# Patient Record
Sex: Male | Born: 1955 | Race: White | Hispanic: No | Marital: Single | Smoking: Current every day smoker
Health system: Southern US, Community
[De-identification: ages and names within clinical notes are randomized; demographics above are authoritative.]

## PROBLEM LIST (undated history)

## (undated) DIAGNOSIS — J439 Emphysema, unspecified: Secondary | ICD-10-CM

## (undated) DIAGNOSIS — I7121 Aneurysm of the ascending aorta, without rupture: Secondary | ICD-10-CM

## (undated) DIAGNOSIS — M5030 Other cervical disc degeneration, unspecified cervical region: Secondary | ICD-10-CM

## (undated) DIAGNOSIS — H9193 Unspecified hearing loss, bilateral: Secondary | ICD-10-CM

## (undated) DIAGNOSIS — R918 Other nonspecific abnormal finding of lung field: Secondary | ICD-10-CM

## (undated) DIAGNOSIS — C439 Malignant melanoma of skin, unspecified: Secondary | ICD-10-CM

## (undated) DIAGNOSIS — M419 Scoliosis, unspecified: Secondary | ICD-10-CM

## (undated) DIAGNOSIS — M549 Dorsalgia, unspecified: Secondary | ICD-10-CM

## (undated) HISTORY — PX: MELANOMA EXCISION: SHX5266

## (undated) HISTORY — DX: Malignant melanoma of skin, unspecified: C43.9

## (undated) HISTORY — PX: NASAL SINUS SURGERY: SHX719

## (undated) HISTORY — DX: Aneurysm of the ascending aorta, without rupture: I71.21

---

## 2010-06-30 ENCOUNTER — Emergency Department (HOSPITAL_COMMUNITY)
Admission: EM | Admit: 2010-06-30 | Discharge: 2010-06-30 | Disposition: A | Discharge status: Home / Self Care | Payer: Non-veteran care | Attending: Emergency Medicine | Admitting: Emergency Medicine

## 2010-06-30 ENCOUNTER — Emergency Department (HOSPITAL_COMMUNITY)
Admit: 2010-06-30 | Discharge: 2010-06-30 | Disposition: A | Discharge status: Home / Self Care | Payer: Non-veteran care

## 2010-06-30 DIAGNOSIS — S7000XA Contusion of unspecified hip, initial encounter: Secondary | ICD-10-CM | POA: Insufficient documentation

## 2010-06-30 DIAGNOSIS — W108XXA Fall (on) (from) other stairs and steps, initial encounter: Secondary | ICD-10-CM | POA: Insufficient documentation

## 2010-06-30 DIAGNOSIS — S20229A Contusion of unspecified back wall of thorax, initial encounter: Secondary | ICD-10-CM | POA: Insufficient documentation

## 2010-06-30 DIAGNOSIS — F329 Major depressive disorder, single episode, unspecified: Secondary | ICD-10-CM | POA: Insufficient documentation

## 2010-06-30 DIAGNOSIS — R109 Unspecified abdominal pain: Secondary | ICD-10-CM | POA: Insufficient documentation

## 2010-06-30 DIAGNOSIS — M545 Low back pain, unspecified: Secondary | ICD-10-CM | POA: Insufficient documentation

## 2010-06-30 DIAGNOSIS — Y929 Unspecified place or not applicable: Secondary | ICD-10-CM | POA: Insufficient documentation

## 2010-06-30 DIAGNOSIS — I10 Essential (primary) hypertension: Secondary | ICD-10-CM | POA: Insufficient documentation

## 2010-06-30 DIAGNOSIS — M2550 Pain in unspecified joint: Secondary | ICD-10-CM | POA: Insufficient documentation

## 2010-06-30 DIAGNOSIS — F3289 Other specified depressive episodes: Secondary | ICD-10-CM | POA: Insufficient documentation

## 2010-07-03 ENCOUNTER — Emergency Department (HOSPITAL_COMMUNITY): Admit: 2010-07-03 | Discharge: 2010-07-03 | Disposition: A | Discharge status: Home / Self Care | Payer: Medicare Other

## 2010-07-03 ENCOUNTER — Emergency Department (HOSPITAL_COMMUNITY)
Admission: EM | Admit: 2010-07-03 | Discharge: 2010-07-03 | Disposition: A | Discharge status: Home / Self Care | Payer: Medicare Other | Attending: Emergency Medicine | Admitting: Emergency Medicine

## 2010-07-03 DIAGNOSIS — M545 Low back pain, unspecified: Secondary | ICD-10-CM | POA: Insufficient documentation

## 2010-07-03 DIAGNOSIS — I10 Essential (primary) hypertension: Secondary | ICD-10-CM | POA: Insufficient documentation

## 2010-07-03 DIAGNOSIS — IMO0002 Reserved for concepts with insufficient information to code with codable children: Secondary | ICD-10-CM | POA: Insufficient documentation

## 2010-07-03 DIAGNOSIS — F329 Major depressive disorder, single episode, unspecified: Secondary | ICD-10-CM | POA: Insufficient documentation

## 2010-07-03 DIAGNOSIS — R42 Dizziness and giddiness: Secondary | ICD-10-CM | POA: Insufficient documentation

## 2010-07-03 DIAGNOSIS — Y92009 Unspecified place in unspecified non-institutional (private) residence as the place of occurrence of the external cause: Secondary | ICD-10-CM | POA: Insufficient documentation

## 2010-07-03 DIAGNOSIS — S32009A Unspecified fracture of unspecified lumbar vertebra, initial encounter for closed fracture: Secondary | ICD-10-CM | POA: Insufficient documentation

## 2010-07-03 DIAGNOSIS — G8929 Other chronic pain: Secondary | ICD-10-CM | POA: Insufficient documentation

## 2010-07-03 DIAGNOSIS — F3289 Other specified depressive episodes: Secondary | ICD-10-CM | POA: Insufficient documentation

## 2010-07-03 DIAGNOSIS — W108XXA Fall (on) (from) other stairs and steps, initial encounter: Secondary | ICD-10-CM | POA: Insufficient documentation

## 2010-07-03 DIAGNOSIS — M4120 Other idiopathic scoliosis, site unspecified: Secondary | ICD-10-CM | POA: Insufficient documentation

## 2010-07-03 DIAGNOSIS — S20229A Contusion of unspecified back wall of thorax, initial encounter: Secondary | ICD-10-CM | POA: Insufficient documentation

## 2010-07-03 LAB — DIFFERENTIAL
Basophils Absolute: 0.1 10*3/uL (ref 0.0–0.1)
Basophils Relative: 1 % (ref 0–1)
Eosinophils Absolute: 0.4 10*3/uL (ref 0.0–0.7)
Eosinophils Relative: 4 % (ref 0–5)
Lymphocytes Relative: 19 % (ref 12–46)
Lymphs Abs: 2.1 10*3/uL (ref 0.7–4.0)
Monocytes Absolute: 0.8 10*3/uL (ref 0.1–1.0)
Monocytes Relative: 8 % (ref 3–12)
Neutro Abs: 7.6 10*3/uL (ref 1.7–7.7)
Neutrophils Relative %: 70 % (ref 43–77)

## 2010-07-03 LAB — CBC
HCT: 41.1 % (ref 39.0–52.0)
Hemoglobin: 14.2 g/dL (ref 13.0–17.0)
MCH: 32.1 pg (ref 26.0–34.0)
MCHC: 34.5 g/dL (ref 30.0–36.0)
MCV: 93 fL (ref 78.0–100.0)
Platelets: 232 10*3/uL (ref 150–400)
RBC: 4.42 MIL/uL (ref 4.22–5.81)
RDW: 12.8 % (ref 11.5–15.5)
WBC: 11 10*3/uL — ABNORMAL HIGH (ref 4.0–10.5)

## 2010-07-03 LAB — BASIC METABOLIC PANEL
BUN: 15 mg/dL (ref 6–23)
CO2: 27 mEq/L (ref 19–32)
Calcium: 9.2 mg/dL (ref 8.4–10.5)
Chloride: 104 mEq/L (ref 96–112)
Creatinine, Ser: 0.78 mg/dL (ref 0.4–1.5)
GFR calc Af Amer: >60 mL/min (ref >60)
GFR calc non Af Amer: >60 mL/min (ref >60)
Glucose, Bld: 99 mg/dL (ref 70–99)
Potassium: 4 mEq/L (ref 3.5–5.1)
Sodium: 138 mEq/L (ref 135–145)

## 2010-11-06 ENCOUNTER — Emergency Department (HOSPITAL_COMMUNITY): Payer: Medicare Other

## 2010-11-06 ENCOUNTER — Inpatient Hospital Stay (HOSPITAL_COMMUNITY)
Admission: EM | Admit: 2010-11-06 | Discharge: 2010-11-09 | DRG: 947 | Disposition: A | Discharge status: Home / Self Care | Payer: Medicare Other | Attending: Internal Medicine | Admitting: Internal Medicine

## 2010-11-06 ENCOUNTER — Emergency Department (HOSPITAL_COMMUNITY)
Admission: EM | Admit: 2010-11-06 | Discharge: 2010-11-06 | Disposition: A | Discharge status: Other Acute Inpatient Hospital | Payer: Medicare Other | Source: Home / Self Care | Attending: Emergency Medicine | Admitting: Emergency Medicine

## 2010-11-06 DIAGNOSIS — R7989 Other specified abnormal findings of blood chemistry: Secondary | ICD-10-CM | POA: Insufficient documentation

## 2010-11-06 DIAGNOSIS — R4182 Altered mental status, unspecified: Secondary | ICD-10-CM | POA: Insufficient documentation

## 2010-11-06 DIAGNOSIS — M1990 Unspecified osteoarthritis, unspecified site: Secondary | ICD-10-CM | POA: Diagnosis present

## 2010-11-06 DIAGNOSIS — I10 Essential (primary) hypertension: Secondary | ICD-10-CM | POA: Insufficient documentation

## 2010-11-06 DIAGNOSIS — G039 Meningitis, unspecified: Secondary | ICD-10-CM | POA: Diagnosis present

## 2010-11-06 DIAGNOSIS — F329 Major depressive disorder, single episode, unspecified: Secondary | ICD-10-CM | POA: Diagnosis present

## 2010-11-06 DIAGNOSIS — S30860A Insect bite (nonvenomous) of lower back and pelvis, initial encounter: Secondary | ICD-10-CM | POA: Diagnosis present

## 2010-11-06 DIAGNOSIS — N179 Acute kidney failure, unspecified: Secondary | ICD-10-CM | POA: Diagnosis present

## 2010-11-06 DIAGNOSIS — G0490 Encephalitis and encephalomyelitis, unspecified: Secondary | ICD-10-CM | POA: Diagnosis present

## 2010-11-06 DIAGNOSIS — IMO0002 Reserved for concepts with insufficient information to code with codable children: Secondary | ICD-10-CM | POA: Diagnosis present

## 2010-11-06 DIAGNOSIS — W57XXXA Bitten or stung by nonvenomous insect and other nonvenomous arthropods, initial encounter: Secondary | ICD-10-CM | POA: Diagnosis present

## 2010-11-06 DIAGNOSIS — M4120 Other idiopathic scoliosis, site unspecified: Secondary | ICD-10-CM | POA: Insufficient documentation

## 2010-11-06 DIAGNOSIS — N390 Urinary tract infection, site not specified: Secondary | ICD-10-CM | POA: Insufficient documentation

## 2010-11-06 DIAGNOSIS — D72829 Elevated white blood cell count, unspecified: Secondary | ICD-10-CM | POA: Insufficient documentation

## 2010-11-06 DIAGNOSIS — F3289 Other specified depressive episodes: Secondary | ICD-10-CM | POA: Insufficient documentation

## 2010-11-06 DIAGNOSIS — Y998 Other external cause status: Secondary | ICD-10-CM

## 2010-11-06 DIAGNOSIS — Y93H2 Activity, gardening and landscaping: Secondary | ICD-10-CM

## 2010-11-06 DIAGNOSIS — Y92009 Unspecified place in unspecified non-institutional (private) residence as the place of occurrence of the external cause: Secondary | ICD-10-CM

## 2010-11-06 DIAGNOSIS — N289 Disorder of kidney and ureter, unspecified: Secondary | ICD-10-CM | POA: Insufficient documentation

## 2010-11-06 LAB — CBC
HCT: 43.1 % (ref 39.0–52.0)
Hemoglobin: 14.4 g/dL (ref 13.0–17.0)
MCH: 31.6 pg (ref 26.0–34.0)
MCHC: 33.4 g/dL (ref 30.0–36.0)
MCV: 94.5 fL (ref 78.0–100.0)
Platelets: 285 10*3/uL (ref 150–400)
RBC: 4.56 MIL/uL (ref 4.22–5.81)
RDW: 14.1 % (ref 11.5–15.5)
WBC: 19.6 10*3/uL — ABNORMAL HIGH (ref 4.0–10.5)

## 2010-11-06 LAB — RAPID URINE DRUG SCREEN, HOSP PERFORMED
Amphetamines: NOT DETECTED
Barbiturates: NOT DETECTED
Benzodiazepines: POSITIVE — AB
Cocaine: NOT DETECTED
Opiates: POSITIVE — AB
Tetrahydrocannabinol: NOT DETECTED

## 2010-11-06 LAB — COMPREHENSIVE METABOLIC PANEL
ALT: 17 U/L (ref 0–53)
AST: 23 U/L (ref 0–37)
Albumin: 4.8 g/dL (ref 3.5–5.2)
Alkaline Phosphatase: 99 U/L (ref 39–117)
BUN: 34 mg/dL — ABNORMAL HIGH (ref 6–23)
CO2: 24 mEq/L (ref 19–32)
Calcium: 11.3 mg/dL — ABNORMAL HIGH (ref 8.4–10.5)
Chloride: 101 mEq/L (ref 96–112)
Creatinine, Ser: 1.69 mg/dL — ABNORMAL HIGH (ref 0.4–1.5)
GFR calc Af Amer: 51 mL/min — ABNORMAL LOW (ref >60)
GFR calc non Af Amer: 42 mL/min — ABNORMAL LOW (ref >60)
Glucose, Bld: 108 mg/dL — ABNORMAL HIGH (ref 70–99)
Potassium: 4.7 mEq/L (ref 3.5–5.1)
Sodium: 139 mEq/L (ref 135–145)
Total Bilirubin: 0.3 mg/dL (ref 0.3–1.2)
Total Protein: 9 g/dL — ABNORMAL HIGH (ref 6.0–8.3)

## 2010-11-06 LAB — SALICYLATE LEVEL: Salicylate Lvl: <2 mg/dL — ABNORMAL LOW (ref 2.8–20.0)

## 2010-11-06 LAB — URINALYSIS, ROUTINE W REFLEX MICROSCOPIC
Bilirubin Urine: NEGATIVE
Glucose, UA: NEGATIVE mg/dL
Hgb urine dipstick: NEGATIVE
Ketones, ur: 15 mg/dL — AB
Leukocytes, UA: NEGATIVE
Nitrite: NEGATIVE
Protein, ur: 30 mg/dL — AB
Specific Gravity, Urine: >1.03 — ABNORMAL HIGH (ref 1.005–1.030)
Urobilinogen, UA: 0.2 mg/dL (ref 0.0–1.0)
pH: 5 (ref 5.0–8.0)

## 2010-11-06 LAB — URINE MICROSCOPIC-ADD ON

## 2010-11-06 LAB — DIFFERENTIAL
Basophils Absolute: 0 10*3/uL (ref 0.0–0.1)
Basophils Relative: 0 % (ref 0–1)
Eosinophils Absolute: 0 10*3/uL (ref 0.0–0.7)
Eosinophils Relative: 0 % (ref 0–5)
Lymphocytes Relative: 14 % (ref 12–46)
Lymphs Abs: 2.7 10*3/uL (ref 0.7–4.0)
Monocytes Absolute: 1.2 10*3/uL — ABNORMAL HIGH (ref 0.1–1.0)
Monocytes Relative: 6 % (ref 3–12)
Neutro Abs: 15.7 10*3/uL — ABNORMAL HIGH (ref 1.7–7.7)
Neutrophils Relative %: 80 % — ABNORMAL HIGH (ref 43–77)

## 2010-11-06 LAB — BLOOD GAS, ARTERIAL
Acid-base deficit: 0.4 mmol/L (ref 0.0–2.0)
Bicarbonate: 24.3 mEq/L — ABNORMAL HIGH (ref 20.0–24.0)
O2 Content: 2 L/min
O2 Saturation: 98.6 %
Patient temperature: 37
TCO2: 21.8 mmol/L (ref 0–100)
pCO2 arterial: 44.3 mmHg (ref 35.0–45.0)
pH, Arterial: 7.359 (ref 7.350–7.450)
pO2, Arterial: 137 mmHg — ABNORMAL HIGH (ref 80.0–100.0)

## 2010-11-06 LAB — PROTIME-INR
INR: 0.99 (ref 0.00–1.49)
Prothrombin Time: 13.3 seconds (ref 11.6–15.2)

## 2010-11-06 LAB — ETHANOL: Alcohol, Ethyl (B): <11 mg/dL — ABNORMAL HIGH (ref 0–10)

## 2010-11-06 LAB — ACETAMINOPHEN LEVEL: Acetaminophen (Tylenol), Serum: <15 ug/mL (ref 10–30)

## 2010-11-06 LAB — APTT: aPTT: 37 seconds (ref 24–37)

## 2010-11-06 LAB — GLUCOSE, CAPILLARY: Glucose-Capillary: 111 mg/dL — ABNORMAL HIGH (ref 70–99)

## 2010-11-06 LAB — AMMONIA: Ammonia: 21 umol/L (ref 11–60)

## 2010-11-06 LAB — CK: Total CK: 180 U/L (ref 7–232)

## 2010-11-07 ENCOUNTER — Inpatient Hospital Stay (HOSPITAL_COMMUNITY): Payer: Medicare Other

## 2010-11-07 ENCOUNTER — Emergency Department (HOSPITAL_COMMUNITY): Payer: Medicare Other

## 2010-11-07 LAB — CBC
HCT: 37.7 % — ABNORMAL LOW (ref 39.0–52.0)
Hemoglobin: 12.5 g/dL — ABNORMAL LOW (ref 13.0–17.0)
MCH: 31.3 pg (ref 26.0–34.0)
MCHC: 33.2 g/dL (ref 30.0–36.0)
MCV: 94.3 fL (ref 78.0–100.0)
Platelets: 247 10*3/uL (ref 150–400)
RBC: 4 MIL/uL — ABNORMAL LOW (ref 4.22–5.81)
RDW: 14.2 % (ref 11.5–15.5)
WBC: 10.9 10*3/uL — ABNORMAL HIGH (ref 4.0–10.5)

## 2010-11-07 LAB — COMPREHENSIVE METABOLIC PANEL
ALT: 15 U/L (ref 0–53)
AST: 18 U/L (ref 0–37)
Albumin: 3.6 g/dL (ref 3.5–5.2)
Alkaline Phosphatase: 80 U/L (ref 39–117)
BUN: 29 mg/dL — ABNORMAL HIGH (ref 6–23)
CO2: 26 mEq/L (ref 19–32)
Calcium: 9.1 mg/dL (ref 8.4–10.5)
Chloride: 107 mEq/L (ref 96–112)
Creatinine, Ser: 0.78 mg/dL (ref 0.4–1.5)
GFR calc Af Amer: >60 mL/min (ref >60)
GFR calc non Af Amer: >60 mL/min (ref >60)
Glucose, Bld: 125 mg/dL — ABNORMAL HIGH (ref 70–99)
Potassium: 3.8 mEq/L (ref 3.5–5.1)
Sodium: 142 mEq/L (ref 135–145)
Total Bilirubin: 0.2 mg/dL — ABNORMAL LOW (ref 0.3–1.2)
Total Protein: 6.7 g/dL (ref 6.0–8.3)

## 2010-11-07 LAB — MRSA PCR SCREENING: MRSA by PCR: NEGATIVE

## 2010-11-07 LAB — MAGNESIUM: Magnesium: 2.2 mg/dL (ref 1.5–2.5)

## 2010-11-07 LAB — PHOSPHORUS: Phosphorus: 3.1 mg/dL (ref 2.3–4.6)

## 2010-11-07 LAB — TSH: TSH: 1.453 u[IU]/mL (ref 0.350–4.500)

## 2010-11-07 MED ORDER — GADOBENATE DIMEGLUMINE 529 MG/ML IV SOLN
20.0000 mL | Freq: Once | INTRAVENOUS | Status: AC | PRN
  Administered 2010-11-07: 20 mL via INTRAVENOUS

## 2010-11-07 NOTE — H&P (Signed)
Kirk Bell, Kirk Bell              ACCOUNT NO.:  [id number]  MEDICAL RECORD NO.:  [id number]  LOCATION:  [facility]ED                          FACILITY:  [facility]  PHYSICIAN:  Osvaldo Shipper, MD     DATE OF BIRTH:  [birth year]  DATE OF ADMISSION:  11/06/2010 DATE OF DISCHARGE:                             HISTORY & PHYSICAL   PRIMARY CARE PHYSICIAN:  In the [place] system in [place].  The name of the physician is unknown at this time.  ADMISSION DIAGNOSES: 1. Encephalopathy of etiology that is unclear. 2. Tick bite rule out meningitis. 3. History of hypertension. 4. History of scoliosis and degenerative disk disease and chronic back     pain. 5. History of depression.  CHIEF COMPLAINT:  Altered mental status.  HISTORY OF PRESENT ILLNESS:  The patient is a 55 year old Caucasian male who has a history of hypertension, chronic back pain secondary to scoliosis and degenerative disk disease who was in his usual state of health over the last couple of days and he has been mowing his lawn working in [employer] doing a lot of outdoor activities. Today, when he was doing the same at some of his relatives house, the patient apparently became very confused.  He started laying on the carpet, started having twitching, jerking movements, was unable to follow commands initially.  The patient also was found to have a tick on his buttock but that was removed although there was no rash.  According to sister-in-law, the patient had a couple of other tick bites and maybe other bites over the last couple of days as well.  So the patient basically presents with these manifestations.  The patient initially when he was seen by the ED physician was able to follow certain commands but was unable to answer any questions.  By the time I saw him, the patient had been given Ativan and so he was sleepy and would not really respond to me.  There is no history of any fever.  No history of any sick contacts or  any sickness in the last few days.  Really, history is very limited in this gentleman.  The sister-in-law mentioned that she had sprayed her lawn with some kind of a bug spray a few days ago.  MEDICATIONS AT HOME:  According to the sister-in-law, he is on the following:  Effexor 150 mg b.i.d., baclofen unknown dose, Vicodin unknown dose, Benadryl as needed and he is on an antihypertensive agent which is not known.  ALLERGIES:  There is a reported allergy to MOTRIN, though this is not very clear.  PAST MEDICAL HISTORY:  Positive for chronic back pain with degenerative disk disease, scoliosis, depression, hypertension.  SURGICAL HISTORY:  Sinus surgery and an episode of possible pneumothorax in the past.  SOCIAL HISTORY:  Based on report from the sister-in-law, he quit drinking in 1990.  He smokes half pack of cigarettes on a daily basis. No illicit drug use.  He is a fairly active person.  FAMILY HISTORY:  His mother died of leukemia.  Father died of dementia. He has a brother with whom he does not have much contact.  He is unmarried and does not have any  children.  REVIEW OF SYSTEMS:  Unable to do in this patient with altered mental status.  PHYSICAL EXAMINATION:  VITAL SIGNS:  Temperature of 96.9 rectally, 98.4 orally, blood pressure 124/75, heart rate 65, respiratory rate 14, saturation 96-100% on 2 liters by nasal cannula. GENERAL:  A well-developed, well-nourished white male in no distress. HEENT:  Head is normocephalic, atraumatic.  Pupils are equal reacting to light.  They are not pinpoint.  Oral mucous membranes difficult to examine, he did not really open his mouth. NECK:  Soft, supple.  I was able to flex it without any difficulties. No thyromegaly appreciated.  No cervical, supraclavicular, inguinal lymphadenopathy is present. LUNGS:  Clear to auscultation bilaterally with no wheezing, rales or rhonchi. CARDIOVASCULAR:  S1-S2 is normal regular.  No S3, S4, rubs,  murmurs or bruits.  No JVD.  No pedal edema. ABDOMEN:  Soft, nontender, nondistended.  Bowel sounds are present.  No masses or organomegaly appreciated. GU:  External examination was unremarkable. MUSCULOSKELETAL:  Normal muscle, mass and tone.  SKIN: No skin rashes are noted. But his face and upper torso are flushed probably from sun exposure. NEUROLOGIC:  He moves all his extremities and this he was doing mostly to pain.  He will not open his eyes spontaneously.  He is not talking to me at this time and I think all this is probably from the Ativan because he was following commands and talking incomprehensibly earlier.  LABORATORY DATA:  His white cell count is 19,600 with 80% neutrophils. Hemoglobin is 14.4, platelet count is 285.  His coags are normal.  His electrolytes are normal.  His glucose is 108, BUN is 34, creatinine is 1.69.  His LFTs are normal.  His total protein is 9.0, albumin is 4.8, calcium is 11.3, ammonia level is 21, creatinine kinase total is 180. His acetaminophen and salicylate levels are normal.  Urine drug screen positive for benzyl and opiates.  He was given Ativan here and then he is on opiates at home.  Alcohol level less than 11.  Urine analysis showed a hazy urine with a specific gravity greater than 1.030, some protein, some ketones, 7-10 wbc's, 0-2 rbc's, many bacteria, although his nitrite and leukocyte esterase is normal.  Blood cultures have been done pending.  He had ABG because of his altered mental status and has pH of 7.35, pCO2 is 44, pO2 is 137, bicarb is 24, saturation 98%.  IMAGING STUDIES:  He had a chest x-ray which showed low lung volumes without any active cardiopulmonary disease.  He had a CT scan of the head which was normal.  Extensive sinus surgery was noted.  He had an EKG which was sinus rhythm with a normal axis.  No Q-waves.  No concerning ST or T-wave changes were noted on this EKG.  ASSESSMENT:  This is a 55 year old man who  presents with altered mental status.  He is in an encephalopathic state.  Etiology of his presentation is not entirely clear.  However, there are many differentials at this time.  The top one at this time is infectious because of recent tick bite and elevated white count.  This be metabolic possibly, he does have some hypercalcemia although is not that severe.  I think his hypercalcemia is probably from his dehydration.  He is also in a dehydrated state.  Could this be related to chemicals that was sprayed and he was working in that environment it is possible although less likely.  His anion gap is normal.  Heat stroke was considered but patient is afebrile.  PLAN: 1. Encephalopathy of unclear etiology.  I think he needs an LP.  I     discussed this extensively with Dr. Weldon Inches and we both came to     the same assessment.  However, because of his scoliosis, we feel     that this should be done under fluoroscopy.  Unfortunately at [facility] this is not available on a Saturday night so the patient will     be transferred to [facility] to undergo LP there.  We will send off     Lyme titers, [facility] spotted fever and Ehrlichiosis titers.     Once the LP is done, he will be given ceftriaxone, doxycycline, and     vancomycin.  I think he will probably benefit from getting MRI of     his brain which will also be ordered to rule out other etiologies.     Because he was having twitching movements we will also get an EEG     on this patient. 2. Dehydration with mild acute renal failure.  He will be given IV     fluids.  Renal function will be followed up on. 3. Hypercalcemia.  This is probably from dehydration.  We will recheck     calcium levels in the morning and if it is still elevated further     workup should be considered. 4. History of chronic back pain and scoliosis, monitor for now. 5. History of hypertension.  Monitor his blood pressure. 6. DVT prophylaxis with SCDs.  I  have discussed this case with Dr. Weldon Inches, Dr. Della Goo, as well as with Dr. Doug Sou who will be the accepting ED physician at [facility].  Once the LP is done in the ED, the patient will be sent up to the step-down floor under the care of [facility].  Depending on the results of the lumbar puncture the orders that I have written may have to be modified.  Further management decisions will depend on results of further testing and patient's response to treatment.   Osvaldo Shipper, MD     GK/MEDQ  D:  11/06/2010  T:  11/06/2010  Job:  [id number]  cc:   Della Goo, M.D.  Electronically Signed by Osvaldo Shipper MD on 11/07/2010 07:28:15 PM

## 2010-11-08 ENCOUNTER — Inpatient Hospital Stay (HOSPITAL_COMMUNITY): Payer: Medicare Other

## 2010-11-08 LAB — COMPREHENSIVE METABOLIC PANEL
ALT: 13 U/L (ref 0–53)
AST: 14 U/L (ref 0–37)
Albumin: 3.2 g/dL — ABNORMAL LOW (ref 3.5–5.2)
Alkaline Phosphatase: 73 U/L (ref 39–117)
BUN: 22 mg/dL (ref 6–23)
CO2: 26 mEq/L (ref 19–32)
Calcium: 8.6 mg/dL (ref 8.4–10.5)
Chloride: 107 mEq/L (ref 96–112)
Creatinine, Ser: 0.66 mg/dL (ref 0.4–1.5)
GFR calc Af Amer: >60 mL/min (ref >60)
GFR calc non Af Amer: >60 mL/min (ref >60)
Glucose, Bld: 105 mg/dL — ABNORMAL HIGH (ref 70–99)
Potassium: 3.9 mEq/L (ref 3.5–5.1)
Sodium: 141 mEq/L (ref 135–145)
Total Bilirubin: 0.2 mg/dL — ABNORMAL LOW (ref 0.3–1.2)
Total Protein: 6.2 g/dL (ref 6.0–8.3)

## 2010-11-08 LAB — SEDIMENTATION RATE: Sed Rate: 23 mm/hr — ABNORMAL HIGH (ref 0–16)

## 2010-11-08 LAB — CBC
HCT: 36.5 % — ABNORMAL LOW (ref 39.0–52.0)
Hemoglobin: 12.2 g/dL — ABNORMAL LOW (ref 13.0–17.0)
MCH: 31.2 pg (ref 26.0–34.0)
MCHC: 33.4 g/dL (ref 30.0–36.0)
MCV: 93.4 fL (ref 78.0–100.0)
Platelets: 231 10*3/uL (ref 150–400)
RBC: 3.91 MIL/uL — ABNORMAL LOW (ref 4.22–5.81)
RDW: 14.5 % (ref 11.5–15.5)
WBC: 12.2 10*3/uL — ABNORMAL HIGH (ref 4.0–10.5)

## 2010-11-08 LAB — PROTIME-INR
INR: 1.11 (ref 0.00–1.49)
Prothrombin Time: 14.5 seconds (ref 11.6–15.2)

## 2010-11-08 LAB — DIFFERENTIAL
Basophils Absolute: 0.1 10*3/uL (ref 0.0–0.1)
Basophils Relative: 1 % (ref 0–1)
Eosinophils Absolute: 0.7 10*3/uL (ref 0.0–0.7)
Eosinophils Relative: 6 % — ABNORMAL HIGH (ref 0–5)
Lymphocytes Relative: 23 % (ref 12–46)
Lymphs Abs: 2.8 10*3/uL (ref 0.7–4.0)
Monocytes Absolute: 1.1 10*3/uL — ABNORMAL HIGH (ref 0.1–1.0)
Monocytes Relative: 9 % (ref 3–12)
Neutro Abs: 7.5 10*3/uL (ref 1.7–7.7)
Neutrophils Relative %: 62 % (ref 43–77)

## 2010-11-08 LAB — LACTATE DEHYDROGENASE: LDH: 189 U/L (ref 94–250)

## 2010-11-09 LAB — BASIC METABOLIC PANEL
BUN: 13 mg/dL (ref 6–23)
CO2: 28 mEq/L (ref 19–32)
Calcium: 8.8 mg/dL (ref 8.4–10.5)
Chloride: 105 mEq/L (ref 96–112)
Creatinine, Ser: 0.68 mg/dL (ref 0.4–1.5)
GFR calc Af Amer: >60 mL/min (ref >60)
GFR calc non Af Amer: >60 mL/min (ref >60)
Glucose, Bld: 95 mg/dL (ref 70–99)
Potassium: 3.7 mEq/L (ref 3.5–5.1)
Sodium: 140 mEq/L (ref 135–145)

## 2010-11-09 LAB — B. BURGDORFI ANTIBODIES BY WB
B burgdorferi IgG Abs (IB): NEGATIVE
B burgdorferi IgM Abs (IB): NEGATIVE

## 2010-11-09 LAB — DIFFERENTIAL
Basophils Absolute: 0.1 10*3/uL (ref 0.0–0.1)
Basophils Relative: 0 % (ref 0–1)
Eosinophils Absolute: 0.7 10*3/uL (ref 0.0–0.7)
Eosinophils Relative: 6 % — ABNORMAL HIGH (ref 0–5)
Lymphocytes Relative: 14 % (ref 12–46)
Lymphs Abs: 1.7 10*3/uL (ref 0.7–4.0)
Monocytes Absolute: 1.3 10*3/uL — ABNORMAL HIGH (ref 0.1–1.0)
Monocytes Relative: 10 % (ref 3–12)
Neutro Abs: 8.9 10*3/uL — ABNORMAL HIGH (ref 1.7–7.7)
Neutrophils Relative %: 70 % (ref 43–77)

## 2010-11-09 LAB — CBC
HCT: 38.5 % — ABNORMAL LOW (ref 39.0–52.0)
Hemoglobin: 12.8 g/dL — ABNORMAL LOW (ref 13.0–17.0)
MCH: 31.5 pg (ref 26.0–34.0)
MCHC: 33.2 g/dL (ref 30.0–36.0)
MCV: 94.8 fL (ref 78.0–100.0)
Platelets: 221 10*3/uL (ref 150–400)
RBC: 4.06 MIL/uL — ABNORMAL LOW (ref 4.22–5.81)
RDW: 14.1 % (ref 11.5–15.5)
WBC: 12.7 10*3/uL — ABNORMAL HIGH (ref 4.0–10.5)

## 2010-11-09 LAB — URINE CULTURE
Colony Count: NO GROWTH
Culture  Setup Time: 201206101939
Culture: NO GROWTH

## 2010-11-09 LAB — ROCKY MTN SPOTTED FVR AB, IGG-BLOOD: RMSF IgG: 0.07 IV

## 2010-11-09 LAB — ROCKY MTN SPOTTED FVR AB, IGM-BLOOD: RMSF IgM: 0.97 IV — ABNORMAL HIGH (ref 0.00–0.89)

## 2010-11-09 MED ORDER — GADOBENATE DIMEGLUMINE 529 MG/ML IV SOLN
20.0000 mL | Freq: Once | INTRAVENOUS | Status: AC
  Administered 2010-11-07: 20 mL via INTRAVENOUS

## 2010-11-09 NOTE — Procedures (Signed)
EEG NUMBER:  HISTORY:  A 55 year old male with a syncopal episode.  MEDICATIONS:  Vancomycin, Rocephin Lovenox, nicotine CQ, Lioresal, Norvasc, Effexor, Vibramycin, Tylenol, Vicodin, and Benadryl.  CONDITIONS OF RECORDING:  This is a 16-channel EEG carried out with the patient in the awake state.  DESCRIPTION:  The waking background activity consists of a low-voltage, symmetrical, fairly well-organized 8-9 Hz alpha activity seen from the parietooccipital and posterior temporal regions.  Low-voltage, fast activity, poorly organized was seen anteriorly and at times superimposed on more posterior rhythms.  A mixture of theta and alpha rhythm is seen from the central and temporal regions.  The patient does not drowse or sleep.  Hypoventilation was performed with only mild build up seen, but no abnormalities.  Intermittent photic stimulation was performed as well and again no abnormalities were noted.  IMPRESSION:  This is a normal awake EEG.  No epileptiform activity is noted.  COMMENT:  An EEG with the patient sleep deprived to elicit drowse and light sleep, may be desirable to further elicit possible seizure disorder.          ______________________________ Thana Farr, MD    JX:BJYN D:  11/08/2010 17:40:28  T:  11/09/2010 03:54:03  Job #:  [id number]

## 2010-11-10 LAB — EHRLICHIA ANTIBODY PANEL
E chaffeensis (HGE) Ab, IgG: NEGATIVE
E chaffeensis (HGE) Ab, IgM: NEGATIVE

## 2010-11-11 LAB — CULTURE, BLOOD (ROUTINE X 2)
Culture: NO GROWTH
Culture: NO GROWTH

## 2010-11-12 NOTE — Discharge Summary (Signed)
Kirk Bell, PAULE              ACCOUNT NO.:  [id number]  MEDICAL RECORD NO.:  [id number]  LOCATION:  3007                         FACILITY:  [facility]  PHYSICIAN:  Hollice Espy, M.D.DATE OF BIRTH:  Jul 14, 1955  DATE OF ADMISSION:  11/06/2010 DATE OF DISCHARGE:  11/09/2010                              DISCHARGE SUMMARY   ATTENDING PHYSICIAN:  Hollice Espy, MD  DISCHARGE DIAGNOSES: 1. Altered mental status, felt to be secondary to either suspected     meningitis versus encephalitis.  Altered mental status is now     resolved. 2. History of depression. 3. Acute renal failure secondary to dehydration, now resolved.  DISCHARGE MEDICATIONS: 1. Rocephin 2 g IV b.i.d. x11 days. 2. Doxycycline 100 mg p.o. b.i.d. x7 days. 3. Baclofen 10 mg p.o. q.8 h. 4. Benadryl 25 p.o. nightly p.r.n. 5. Norvasc 5 mg p.o. daily. 6. Effexor 225 p.o. daily. 7. Vicodin 10/500 one tablet p.o. q.4 h.  DISCHARGE DIET:  Heart-healthy diet.ACTIVITY:  Slowly increase.  DISPOSITION:  Improved.  The patient is being discharged to home.  He will follow up with PCP at the [facility] in the next 1-2 weeks.  HOSPITAL COURSE:  The patient is a 55 year old white male with past history of hypertension and depression as well as DJD who was in his usual state of health when he was out mowing his lawn and also had been doing some other outdoor activities for the past several days prior to admission.  He started becoming very confused, he tried to lying on the carpet, was having some jerking involuntary movement, unable to follow commands.  On examination of the patient, the family found a tick on his backside but other than that was unremarkable and the patient according to the sister-in-law had a couple of tick bites in the previous days they had noted.  When the patient was brought into the emergency room at [facility], he was seen by the ER physician.  He looked to be following some commands but  was unable to answer any questions and looked to be quite somnolent and has no history of fever.  He was noted on admission to have a white count of 19,000 with 80% neutrophils, elevated BUN of 34, creatinine of 1.69, slightly elevated calcium of 11.3, and normal ammonia level.  Urine drug screen was noted for benzos and opiates, benzos given in the emergency room, opiates at home and the rest of his workup was essentially unremarkable.  Chest x-ray noted low lung volumes, otherwise, unclear.  The patient was unable to have an LP done at [facility] because of the scoliosis, so they felt that he could have one done under fluoroscopy.  He was then transferred over to [facility]. [facility] spotted fever, ehrlichiosis, and Lyme titers were sent as well as other workup was done.  The patient was rehydrated.  Once blood cultures were obtained, he was started on appropriate antibiotics for suspected meningitis as well as encephalitis, specifically doxycycline and 2 g IV Rocephin q.12 h.  By hospital day 2, the patient was much improved.  He underwent an attempted LP by fluoroscopy, however, due to the patient's scoliosis and DJD,  they were unable to get any kind of spinal fluid sample.  He was treated empirically.  Nevertheless by hospital day 2, his white count was normalized.  He was afebrile.  His renal function had fully resolved.  He was feeling better.  His lab work was essentially unremarkable and he was monitored more closely and by hospital day 3, he was fully completely alert and oriented, feeling himself. I discussed this case with Infectious Disease and we felt that given that we were not able to obtain a sample and his history, we still have to treat empirically for meningitis as well as for any type of tick- borne disease.  This would include 2 g of IV Rocephin q.12 h. in the IV setting as well as his doxycycline which could be given p.o.  His white count started to trend  slightly back up into the 12 and it stayed that way and by November 09, 2010, day of discharge, he was noted to have a normal neutrophil count but certainly, his eosinophil count was minimally elevated.  Also interestingly with his [facility] spotted fever, IgM titer came back elevated.  His IgG titer came back normal. His IgM titer is weakly positive at 0.97 with 0.90 being the cut-off, 0.97 being possible acute infection.  Nevertheless, plan will be for the patient to be discharged to home.  Home health agency will follow up through the [place] and deliver his IV Rocephin starting on November 10, 2010, morning.  He will get his final dose for November 09, 2010, prior to discharge.  The rest of his medical issues were stable during this hospitalization, and again, he will follow up with [facility].     Hollice Espy, M.D.     SKK/MEDQ  D:  11/09/2010  T:  11/10/2010  Job:  [id number]  cc:   [facility]  Electronically Signed by Virginia Rochester M.D. on 11/12/2010 09:41:29 AM

## 2012-05-09 IMAGING — CR DG CHEST 1V
1 series · 1 of 1 positions shown · non-contrast
Comparison: None.

CLINICAL DATA: Altered mental status.

CHEST - 1 VIEW

[view not recorded]
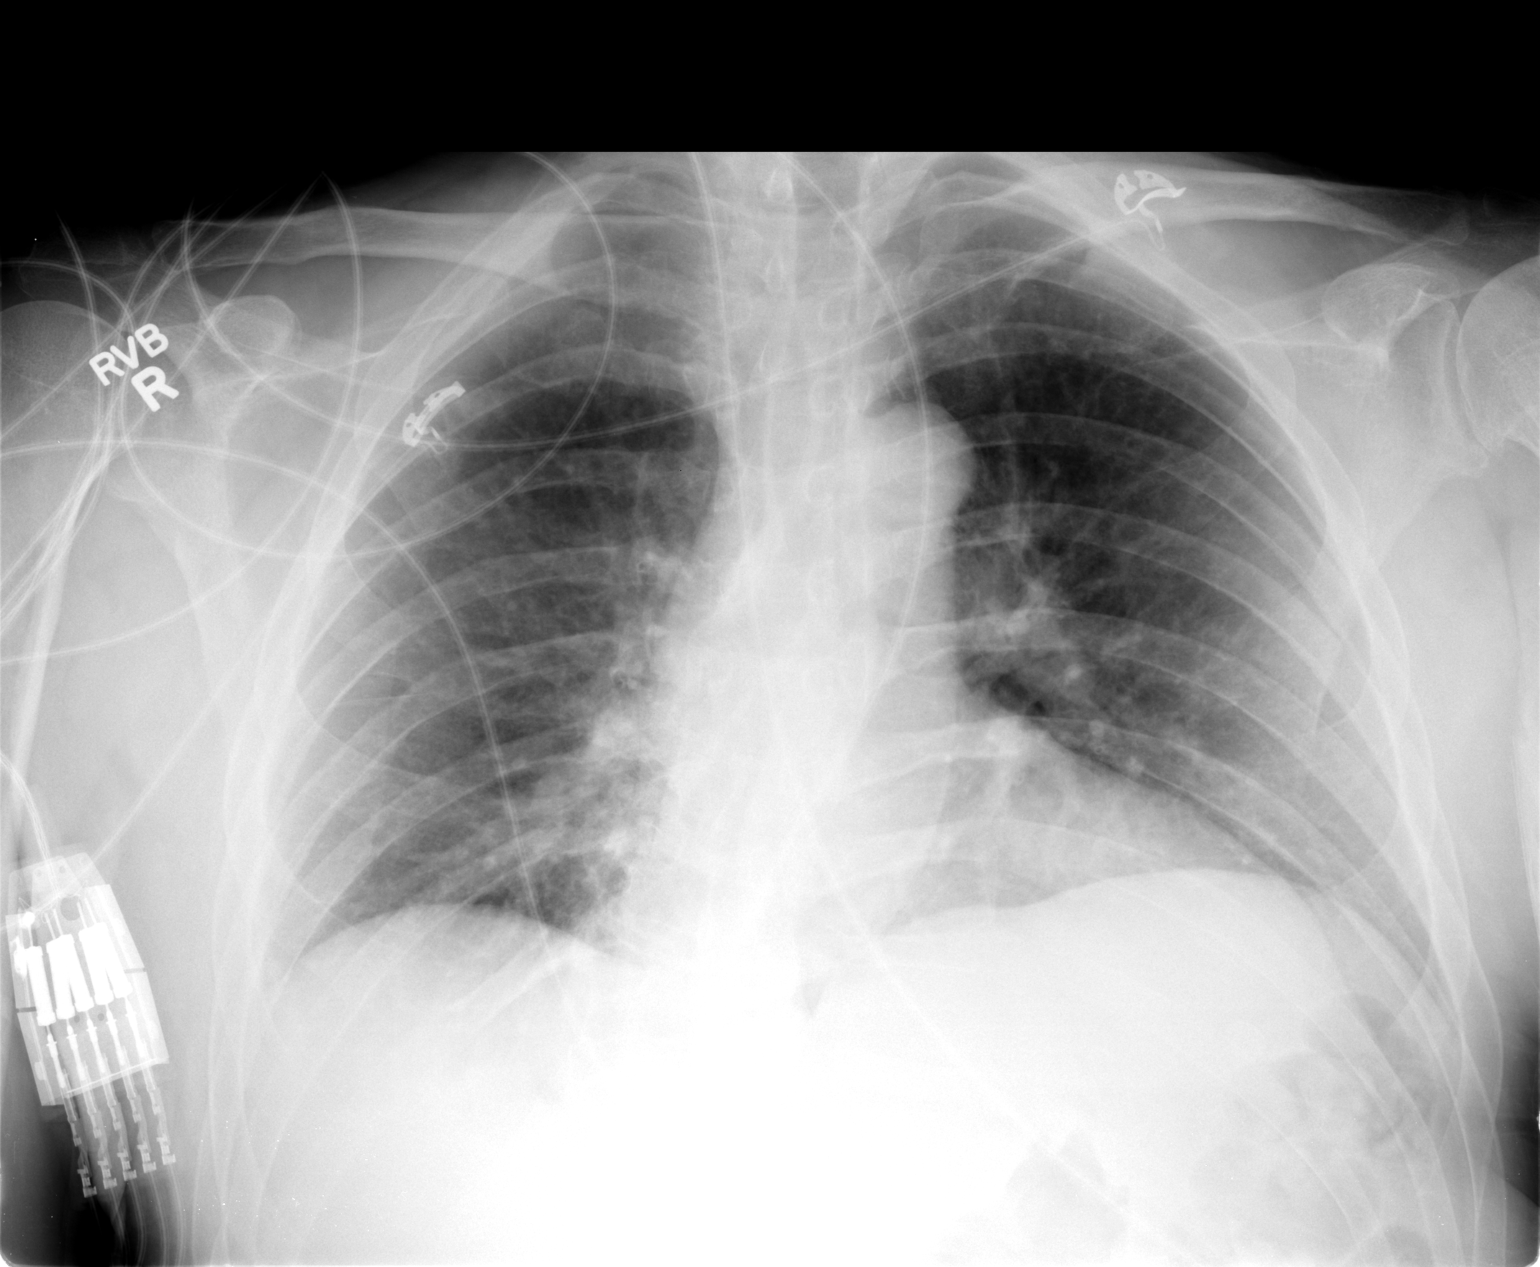

[1 of 1 positions shown; findings below may reference images not displayed]

FINDINGS: The lung volumes are low.  This exaggerates the heart
size.  No focal airspace disease is present.  The visualized soft
tissues and bony thorax are unremarkable.
IMPRESSION: 1.  Low lung volumes.
2.  No acute cardiopulmonary disease.

## 2013-02-11 ENCOUNTER — Emergency Department (HOSPITAL_COMMUNITY)
Admission: EM | Admit: 2013-02-11 | Discharge: 2013-02-11 | Disposition: A | Discharge status: Home / Self Care | Payer: Medicare Other | Attending: Emergency Medicine | Admitting: Emergency Medicine

## 2013-02-11 ENCOUNTER — Encounter (HOSPITAL_COMMUNITY): Payer: Self-pay

## 2013-02-11 DIAGNOSIS — M549 Dorsalgia, unspecified: Secondary | ICD-10-CM

## 2013-02-11 DIAGNOSIS — M545 Low back pain, unspecified: Secondary | ICD-10-CM | POA: Insufficient documentation

## 2013-02-11 DIAGNOSIS — F17200 Nicotine dependence, unspecified, uncomplicated: Secondary | ICD-10-CM | POA: Insufficient documentation

## 2013-02-11 DIAGNOSIS — R52 Pain, unspecified: Secondary | ICD-10-CM | POA: Insufficient documentation

## 2013-02-11 DIAGNOSIS — G8929 Other chronic pain: Secondary | ICD-10-CM | POA: Insufficient documentation

## 2013-02-11 HISTORY — DX: Dorsalgia, unspecified: M54.9

## 2013-02-11 MED ORDER — HYDROCODONE-ACETAMINOPHEN 5-325 MG PO TABS: 2.0000 | ORAL_TABLET | ORAL | Status: DC | PRN

## 2013-02-11 MED ORDER — DEXAMETHASONE SODIUM PHOSPHATE 4 MG/ML IJ SOLN
8.0000 mg | Freq: Once | INTRAMUSCULAR | Status: AC
  Administered 2013-02-11: 8 mg via INTRAVENOUS
  Filled 2013-02-11: qty 2

## 2013-02-11 MED ORDER — HYDROMORPHONE HCL PF 1 MG/ML IJ SOLN
1.0000 mg | Freq: Once | INTRAMUSCULAR | Status: AC
  Administered 2013-02-11: 1 mg via INTRAVENOUS
  Filled 2013-02-11: qty 1

## 2013-02-11 MED ORDER — NAPROXEN 500 MG PO TABS: 500.0000 mg | ORAL_TABLET | Freq: Two times a day (BID) | ORAL | Status: DC

## 2013-02-11 MED ORDER — METHOCARBAMOL 500 MG PO TABS: 500.0000 mg | ORAL_TABLET | Freq: Two times a day (BID) | ORAL | Status: DC | PRN

## 2013-02-11 MED ORDER — METHOCARBAMOL 500 MG PO TABS
500.0000 mg | ORAL_TABLET | Freq: Once | ORAL | Status: AC
  Administered 2013-02-11: 500 mg via ORAL
  Filled 2013-02-11: qty 1

## 2013-02-11 NOTE — ED Provider Notes (Signed)
CSN: [id number]     Arrival date & time 02/11/13  5621 History  This chart was scribed for Kirk Roller, MD by Quintella Reichert, ED scribe.  This patient was seen in room APA05/APA05 and the patient's care was started at 8:26 AM.   Chief Complaint  Patient presents with  . Back Pain    The history is provided by the patient. No language interpreter was used.    HPI Comments: Kirk Bell is a 57 y.o. male who presents to the Emergency Department complaining of acute-onset, persistent, gradually-worsening exacerbation of his chronic back pain.  Pt reports that 2 days ago he was repeatedly bending over filling a wheelbarrow with grass.  Yesterday evening he developed constant, moderate-to-severe pain to his left lower back that gradually worsened throughout the night.  He denies dysuria, incontinence, difficulty urinating, numbness or weakness in legs, or any other associated symptoms.  Pt denies red flags for pathologic back pain.  He denies h/o IV drug abuse or insulin use.   Past Medical History  Diagnosis Date  . Back pain      Past Surgical History  Procedure Laterality Date  . Nasal sinus surgery       No family history on file.   History  Substance Use Topics  . Smoking status: Current Every Day Smoker  . Smokeless tobacco: Not on file  . Alcohol Use: No     Review of Systems  Genitourinary: Negative for decreased urine volume.  Musculoskeletal: Positive for back pain.  Neurological: Negative for numbness.    Allergies  Ibuprofen  Home Medications   Current Outpatient Rx  Name  Route  Sig  Dispense  Refill  . HYDROcodone-acetaminophen (NORCO/VICODIN) 5-325 MG per tablet   Oral   Take 2 tablets by mouth every 4 (four) hours as needed for pain.   10 tablet   0   . methocarbamol (ROBAXIN) 500 MG tablet   Oral   Take 1 tablet (500 mg total) by mouth 2 (two) times daily as needed.   20 tablet   0   . naproxen (NAPROSYN) 500 MG tablet   Oral   Take  1 tablet (500 mg total) by mouth 2 (two) times daily with a meal.   30 tablet   0     BP 133/103  Pulse 66  Temp(Src) 98.4 F (36.9 C) (Oral)  Resp 20  Ht 6' (1.829 m)  Wt 180 lb (81.647 kg)  BMI 24.41 kg/m2  SpO2 98%  Physical Exam  Nursing note and vitals reviewed. Constitutional: He appears well-developed and well-nourished. No distress.  HENT:  Head: Normocephalic and atraumatic.  Mouth/Throat: Oropharynx is clear and moist. No oropharyngeal exudate.  Eyes: Conjunctivae and EOM are normal. Pupils are equal, round, and reactive to light. Right eye exhibits no discharge. Left eye exhibits no discharge. No scleral icterus.  Neck: Normal range of motion. Neck supple. No JVD present. No thyromegaly present.  Cardiovascular: Normal rate, regular rhythm, normal heart sounds and intact distal pulses.  Exam reveals no gallop and no friction rub.   No murmur heard. Palpable pulses Normal capillary refill  Pulmonary/Chest: Effort normal and breath sounds normal. No respiratory distress. He has no wheezes. He has no rales.  Abdominal: Soft. Bowel sounds are normal. He exhibits no distension and no mass. There is no tenderness.  Musculoskeletal: Normal range of motion. He exhibits tenderness. He exhibits no edema.  Tenderness to left mid-back and side  Lymphadenopathy:  He has no cervical adenopathy.  Neurological: He is alert. Coordination normal.  Normal sensation and strength of bilateral lower extremities  Skin: Skin is warm and dry. No rash noted. No erythema.  Psychiatric: He has a normal mood and affect. His behavior is normal.    ED Course  Procedures (including critical care time)  DIAGNOSTIC STUDIES: Oxygen Saturation is 98% on room air, normal by my interpretation.    COORDINATION OF CARE: 8:34 AM-Discussed treatment plan which includes pain medication and muscle relaxants with pt at bedside and pt agreed to plan.    Labs Review Labs Reviewed - No data to  display  Imaging Review No results found.  MDM   1. Back pain    The patient has ongoing back pain, he does have a history of chronic back pain which has flared medication which has been given in the emergency department and will be prescribed her home, see followup list. No red flags for pathologic back   Meds given in ED:  Medications  HYDROmorphone (DILAUDID) injection 1 mg (1 mg Intravenous Given 02/11/13 0850)  dexamethasone (DECADRON) injection 8 mg (8 mg Intravenous Given 02/11/13 0850)  methocarbamol (ROBAXIN) tablet 500 mg (500 mg Oral Given 02/11/13 0849)    New Prescriptions   HYDROCODONE-ACETAMINOPHEN (NORCO/VICODIN) 5-325 MG PER TABLET    Take 2 tablets by mouth every 4 (four) hours as needed for pain.   METHOCARBAMOL (ROBAXIN) 500 MG TABLET    Take 1 tablet (500 mg total) by mouth 2 (two) times daily as needed.   NAPROXEN (NAPROSYN) 500 MG TABLET    Take 1 tablet (500 mg total) by mouth 2 (two) times daily with a meal.      I personally performed the services described in this documentation, which was scribed in my presence. The recorded information has been reviewed and is accurate.      Kirk Roller, MD 02/11/13 [phone]

## 2013-02-11 NOTE — ED Notes (Signed)
Pt reports has chronic back pain.  Reports has been working out in the yard recently and pain flared up yesterday.  C/o pain to left lower back and flank area.   Pt has tens unit for back pain.

## 2013-02-11 NOTE — ED Notes (Signed)
Pt alert & oriented x4, stable gait. Patient given discharge instructions, paperwork & prescription(s). Patient  instructed to stop at the registration desk to finish any additional paperwork. Patient verbalized understanding. Pt left department w/ no further questions. 

## 2013-10-29 ENCOUNTER — Telehealth (HOSPITAL_COMMUNITY): Payer: Self-pay

## 2013-10-30 ENCOUNTER — Ambulatory Visit (HOSPITAL_COMMUNITY): Payer: Non-veteran care | Admitting: Physical Therapy

## 2017-11-14 DIAGNOSIS — M47816 Spondylosis without myelopathy or radiculopathy, lumbar region: Secondary | ICD-10-CM | POA: Insufficient documentation

## 2017-11-14 DIAGNOSIS — M48061 Spinal stenosis, lumbar region without neurogenic claudication: Secondary | ICD-10-CM | POA: Insufficient documentation

## 2017-11-14 DIAGNOSIS — M419 Scoliosis, unspecified: Secondary | ICD-10-CM | POA: Insufficient documentation

## 2017-11-14 DIAGNOSIS — G8929 Other chronic pain: Secondary | ICD-10-CM | POA: Insufficient documentation

## 2019-05-02 ENCOUNTER — Other Ambulatory Visit: Payer: Self-pay

## 2019-05-02 DIAGNOSIS — Z20822 Contact with and (suspected) exposure to covid-19: Secondary | ICD-10-CM

## 2019-05-04 LAB — NOVEL CORONAVIRUS, NAA: SARS-CoV-2, NAA: NOT DETECTED

## 2019-05-07 ENCOUNTER — Telehealth: Payer: Self-pay | Admitting: *Deleted

## 2019-05-07 NOTE — Telephone Encounter (Signed)
Pt called for covid results;negative. Pt verbalizes understanding.

## 2019-06-12 ENCOUNTER — Ambulatory Visit: Payer: Medicare Other | Attending: Internal Medicine

## 2019-06-12 ENCOUNTER — Other Ambulatory Visit: Payer: Self-pay

## 2019-06-12 DIAGNOSIS — Z20822 Contact with and (suspected) exposure to covid-19: Secondary | ICD-10-CM

## 2019-06-13 LAB — NOVEL CORONAVIRUS, NAA: SARS-CoV-2, NAA: NOT DETECTED

## 2019-06-14 ENCOUNTER — Telehealth: Payer: Self-pay | Admitting: *Deleted

## 2019-06-14 NOTE — Telephone Encounter (Signed)
He called in requesting his COVID-19 test result.   I let him know it was not detected meaning he did not have the virus.

## 2019-08-20 ENCOUNTER — Other Ambulatory Visit: Payer: Self-pay

## 2019-08-20 ENCOUNTER — Ambulatory Visit: Payer: Medicare Other | Attending: Internal Medicine

## 2019-08-20 DIAGNOSIS — Z20822 Contact with and (suspected) exposure to covid-19: Secondary | ICD-10-CM

## 2019-08-21 ENCOUNTER — Telehealth: Payer: Self-pay | Admitting: *Deleted

## 2019-08-21 LAB — NOVEL CORONAVIRUS, NAA: SARS-CoV-2, NAA: NOT DETECTED

## 2019-08-21 LAB — SARS-COV-2, NAA 2 DAY TAT

## 2019-08-21 NOTE — Telephone Encounter (Signed)
Reviewed negative Covid19 results with the patient. No further questions. 

## 2019-09-18 ENCOUNTER — Other Ambulatory Visit: Payer: Self-pay

## 2019-09-18 ENCOUNTER — Emergency Department (HOSPITAL_COMMUNITY)
Admission: EM | Admit: 2019-09-18 | Discharge: 2019-09-18 | Disposition: A | Discharge status: Home / Self Care | Payer: No Typology Code available for payment source | Attending: Emergency Medicine | Admitting: Emergency Medicine

## 2019-09-18 ENCOUNTER — Encounter (HOSPITAL_COMMUNITY): Payer: Self-pay

## 2019-09-18 DIAGNOSIS — F17200 Nicotine dependence, unspecified, uncomplicated: Secondary | ICD-10-CM | POA: Insufficient documentation

## 2019-09-18 DIAGNOSIS — B0229 Other postherpetic nervous system involvement: Secondary | ICD-10-CM | POA: Diagnosis not present

## 2019-09-18 DIAGNOSIS — Z79899 Other long term (current) drug therapy: Secondary | ICD-10-CM | POA: Diagnosis not present

## 2019-09-18 DIAGNOSIS — M5412 Radiculopathy, cervical region: Secondary | ICD-10-CM | POA: Diagnosis not present

## 2019-09-18 DIAGNOSIS — M542 Cervicalgia: Secondary | ICD-10-CM | POA: Diagnosis present

## 2019-09-18 HISTORY — DX: Unspecified hearing loss, bilateral: H91.93

## 2019-09-18 HISTORY — DX: Other cervical disc degeneration, unspecified cervical region: M50.30

## 2019-09-18 HISTORY — DX: Scoliosis, unspecified: M41.9

## 2019-09-18 HISTORY — DX: Emphysema, unspecified: J43.9

## 2019-09-18 LAB — CBC WITH DIFFERENTIAL/PLATELET
Abs Immature Granulocytes: 0.08 10*3/uL — ABNORMAL HIGH (ref 0.00–0.07)
Basophils Absolute: 0.1 10*3/uL (ref 0.0–0.1)
Basophils Relative: 0 %
Eosinophils Absolute: 0.3 10*3/uL (ref 0.0–0.5)
Eosinophils Relative: 2 %
HCT: 36.9 % — ABNORMAL LOW (ref 39.0–52.0)
Hemoglobin: 11.9 g/dL — ABNORMAL LOW (ref 13.0–17.0)
Immature Granulocytes: 1 %
Lymphocytes Relative: 15 %
Lymphs Abs: 2.3 10*3/uL (ref 0.7–4.0)
MCH: 31.4 pg (ref 26.0–34.0)
MCHC: 32.2 g/dL (ref 30.0–36.0)
MCV: 97.4 fL (ref 80.0–100.0)
Monocytes Absolute: 1.8 10*3/uL — ABNORMAL HIGH (ref 0.1–1.0)
Monocytes Relative: 12 %
Neutro Abs: 10.8 10*3/uL — ABNORMAL HIGH (ref 1.7–7.7)
Neutrophils Relative %: 70 %
Platelets: 281 10*3/uL (ref 150–400)
RBC: 3.79 MIL/uL — ABNORMAL LOW (ref 4.22–5.81)
RDW: 13.8 % (ref 11.5–15.5)
WBC: 15.3 10*3/uL — ABNORMAL HIGH (ref 4.0–10.5)
nRBC: 0 % (ref 0.0–0.2)

## 2019-09-18 LAB — BASIC METABOLIC PANEL
Anion gap: 9 (ref 5–15)
BUN: 19 mg/dL (ref 8–23)
CO2: 26 mmol/L (ref 22–32)
Calcium: 8.7 mg/dL — ABNORMAL LOW (ref 8.9–10.3)
Chloride: 102 mmol/L (ref 98–111)
Creatinine, Ser: 0.8 mg/dL (ref 0.61–1.24)
GFR calc Af Amer: >60 mL/min (ref >60)
GFR calc non Af Amer: >60 mL/min (ref >60)
Glucose, Bld: 121 mg/dL — ABNORMAL HIGH (ref 70–99)
Potassium: 4 mmol/L (ref 3.5–5.1)
Sodium: 137 mmol/L (ref 135–145)

## 2019-09-18 NOTE — Discharge Instructions (Addendum)
As we discussed I think your pain is either secondary to having the shingles with persistent nerve pain.  Or you could have a cervical radiculopathy.  The MRI that the [place] system has scheduled for you is very appropriate.  As we discussed already you are not able to take prednisone and since you are under pain management not able to have any additional pain medicines.  Would recommend following up with the [place] system.  Return for fevers or any confusion.

## 2019-09-18 NOTE — ED Provider Notes (Signed)
[facility] Provider Note   CSN: [id number] Arrival date & time: 09/18/19  0805     History Chief Complaint  Patient presents with  . Headache    Kirk Bell is a 64 y.o. male.  Patient followed by the [place] system.  Patient has chronic back pain.  Is followed by pain management.  Patient also on March 30 developed herpes zoster shingle with distribution at the base of the hairline left neck and towards the left shoulder area.  Patient was placed on prednisone for that.  But then developed a nonshingle type eczema rash according to the patient's wife and had to stop that.  Patient has had persistent pain kind of left side neck somewhat to the head and radiating towards the left arm ever since the shingles has developed.  He is got a little bit more of a posterior headache on both sides I contacted the [place] [facility] and recommended to be evaluated.  Patient has in the remote past had encephalitis.  At that time due to his significant back abnormalities they were not able to do a lumbar puncture so he was treated as if he could have meningitis or encephalitis.  He did recover.  No fevers.  No nausea or vomiting.  No visual changes.  No confusion.  Patient states that he does get increased pain prickly to the left arm with certain position of his neck.        Past Medical History:  Diagnosis Date  . Back pain   . Degenerative disc disease, cervical   . Emphysema of lung ([place])   . Hearing impaired person, bilateral   . Scoliosis     There are no problems to display for this patient.   Past Surgical History:  Procedure Laterality Date  . NASAL SINUS SURGERY         History reviewed. No pertinent family history.  Social History   Tobacco Use  . Smoking status: Current Every Day Smoker    Packs/day: 0.75  . Smokeless tobacco: Never Used  Substance Use Topics  . Alcohol use: No  . Drug use: No    Home Medications Prior to Admission medications    Medication Sig Start Date End Date Taking? Authorizing Provider  HYDROcodone-acetaminophen (NORCO/VICODIN) 5-325 MG per tablet Take 2 tablets by mouth every 4 (four) hours as needed for pain. 02/11/13   Noemi Chapel, MD  methocarbamol (ROBAXIN) 500 MG tablet Take 1 tablet (500 mg total) by mouth 2 (two) times daily as needed. 02/11/13   Noemi Chapel, MD  naproxen (NAPROSYN) 500 MG tablet Take 1 tablet (500 mg total) by mouth 2 (two) times daily with a meal. 02/11/13   Noemi Chapel, MD    Allergies    Prednisone and Ibuprofen  Review of Systems   Review of Systems  Constitutional: Negative for chills and fever.  HENT: Negative for congestion, rhinorrhea and sore throat.   Eyes: Negative for visual disturbance.  Respiratory: Negative for cough and shortness of breath.   Cardiovascular: Negative for chest pain and leg swelling.  Gastrointestinal: Negative for abdominal pain, diarrhea, nausea and vomiting.  Genitourinary: Negative for dysuria.  Musculoskeletal: Positive for back pain and neck pain.  Skin: Negative for rash.  Neurological: Positive for numbness. Negative for dizziness, light-headedness and headaches.  Hematological: Does not bruise/bleed easily.  Psychiatric/Behavioral: Negative for confusion.    Physical Exam Updated Vital Signs BP 136/90 (BP Location: Right Arm)   Pulse 76   Temp  98 F (36.7 C) (Oral)   Resp 18   Ht 1.829 m (6')   Wt 81.6 kg   SpO2 95%   BMI 24.41 kg/m   Physical Exam Vitals and nursing note reviewed.  Constitutional:      Appearance: Normal appearance. He is well-developed.  HENT:     Head: Normocephalic and atraumatic.  Eyes:     Extraocular Movements: Extraocular movements intact.     Conjunctiva/sclera: Conjunctivae normal.     Pupils: Pupils are equal, round, and reactive to light.  Cardiovascular:     Rate and Rhythm: Normal rate and regular rhythm.     Heart sounds: No murmur.  Pulmonary:     Effort: Pulmonary effort is  normal. No respiratory distress.     Breath sounds: Normal breath sounds.  Abdominal:     Palpations: Abdomen is soft.     Tenderness: There is no abdominal tenderness.  Musculoskeletal:        General: Swelling present. Normal range of motion.     Cervical back: Normal range of motion and neck supple. No rigidity.     Comments: Patient with some swelling to the left hand.  Radial pulses 2+.  Good strength in the hand.  No erythema.  Questionable numbness to index and middle finger.  Lymphadenopathy:     Cervical: No cervical adenopathy.  Skin:    General: Skin is warm and dry.     Capillary Refill: Capillary refill takes less than 2 seconds.     Findings: No erythema or rash.  Neurological:     General: No focal deficit present.     Mental Status: He is alert and oriented to person, place, and time.     ED Results / Procedures / Treatments   Labs (all labs ordered are listed, but only abnormal results are displayed) Labs Reviewed  CBC WITH DIFFERENTIAL/PLATELET - Abnormal; Notable for the following components:      Result Value   WBC 15.3 (*)    RBC 3.79 (*)    Hemoglobin 11.9 (*)    HCT 36.9 (*)    Neutro Abs 10.8 (*)    Monocytes Absolute 1.8 (*)    Abs Immature Granulocytes 0.08 (*)    All other components within normal limits  BASIC METABOLIC PANEL - Abnormal; Notable for the following components:   Glucose, Bld 121 (*)    Calcium 8.7 (*)    All other components within normal limits    EKG None  Radiology No results found.  Procedures Procedures (including critical care time)  Medications Ordered in ED Medications - No data to display  ED Course  I have reviewed the triage vital signs and the nursing notes.  Pertinent labs & imaging results that were available during my care of the patient were reviewed by me and considered in my medical decision making (see chart for details).    MDM Rules/Calculators/A&P                     Clinically no real  concerns for encephalitis or meningitis at this point in time.  Patient does have a slightly elevated white blood cell count.  But no fevers.  Electrolytes without significant abnormalities.  Patient not tachycardic temp is 98 oxygen saturations room air 95% blood pressure 136/90.  Feel that symptoms are most likely consistent with a post herpetic neuralgia.  Or could be due to a cervical disc problem.  Patient already has MRI of the  neck scheduled through the [place] system for later this week.  Patient will return for any development of fevers or any confusion or any mental status changes.  Patient not allowed to have pain medicines since he is followed by pain management.  Patient already had failed treatment with prednisone where he developed some kind of unusual rash resulting in then stopping the prednisone.  Exactly clear what type of rash this was.  No evidence of any further shingle-like rash.  That is all resolved.     Final Clinical Impression(s) / ED Diagnoses Final diagnoses:  Cervical radiculopathy  HZV (herpes zoster virus) post herpetic neuralgia    Rx / DC Orders ED Discharge Orders    None       Fredia Sorrow, MD 09/18/19 1037

## 2019-09-18 NOTE — ED Triage Notes (Addendum)
Pt c/o headache, stiff neck and pain throughout left shoulder since March 30,2021. Pt recently completed Valtrex prescription for shingles. Pt wife states pt sx began after Shingles virus. States shingles rash is healed at this time.   Also c/o swelling to left wrist and right calf. States swelling began 3 days ago. Pt unable to move left arm upwards or outwards.    Pt scheduled for MRI September 26, 2019 to evaluate head and neck. Pt [facility] states [facility] for [facility] with concern for Meningitis.

## 2019-10-23 ENCOUNTER — Telehealth (HOSPITAL_COMMUNITY): Payer: Self-pay | Admitting: Physical Therapy

## 2019-10-23 ENCOUNTER — Encounter (HOSPITAL_COMMUNITY): Payer: Self-pay

## 2019-10-23 ENCOUNTER — Ambulatory Visit (HOSPITAL_COMMUNITY): Payer: No Typology Code available for payment source | Admitting: Physical Therapy

## 2019-10-23 NOTE — Telephone Encounter (Signed)
pt called to cx today's appt due to he wanted to talk with his md to see if this is the way to go with his therapy.

## 2021-11-24 ENCOUNTER — Encounter: Payer: Self-pay | Admitting: Family Medicine

## 2021-12-01 ENCOUNTER — Encounter: Payer: Self-pay | Admitting: Internal Medicine

## 2021-12-27 ENCOUNTER — Institutional Professional Consult (permissible substitution) (INDEPENDENT_AMBULATORY_CARE_PROVIDER_SITE_OTHER): Payer: No Typology Code available for payment source | Admitting: Physician Assistant

## 2021-12-27 DIAGNOSIS — I712 Thoracic aortic aneurysm, without rupture, unspecified: Secondary | ICD-10-CM | POA: Insufficient documentation

## 2021-12-27 NOTE — Progress Notes (Signed)
[address]       [place] [address]             [phone]      PCP is McDonald, Genoveva Ill, PA-C Referring Provider is Myrla Halsted, MD  Reason for consult: Evaluation and surveillance of thoracic aortic aneurysm   HPI: Mr. Kirk Bell is a 66 year old gentleman with past history of generative disc disease with chronic back pain scoliosis, and emphysema with ongoing tobacco use.  He also has a history of melanoma and is status post resection of a lesion on his left back but he has a shoulder blade and subsequent left axillary node dissection.  He was recently undergoing evaluation by his PCP with a CT of the chest and was discovered to have a 4.5 cm ascending aortic aneurysm.   He has no known prior history of vascular disease.  His brother had an abdominal aortic aneurysm.  He was referred to CT surgery for evaluation and surveillance.  Past Medical History:  Diagnosis Date   Back pain    Degenerative disc disease, cervical    Emphysema of lung ([place])    Hearing impaired person, bilateral    Scoliosis     Past Surgical History:  Procedure Laterality Date   NASAL SINUS SURGERY      No family history on file.  Social History Social History   Tobacco Use   Smoking status: Every Day    Packs/day: 0.75    Types: Cigarettes   Smokeless tobacco: Never  Substance Use Topics   Alcohol use: No   Drug use: No    Current Outpatient Medications  Medication Sig Dispense Refill   HYDROcodone-acetaminophen (NORCO/VICODIN) 5-325 MG per tablet Take 2 tablets by mouth every 4 (four) hours as needed for pain. 10 tablet 0   methocarbamol (ROBAXIN) 500 MG tablet Take 1 tablet (500 mg total) by mouth 2 (two) times daily as needed. 20 tablet 0   naproxen (NAPROSYN) 500 MG tablet Take 1 tablet (500 mg total) by mouth 2 (two) times daily with a meal. 30 tablet 0   No current facility-administered medications for this visit.    Allergies  Allergen Reactions    Prednisone Rash    blisters   Ibuprofen     diarrhea   Morphine Other (See Comments)   Oxycodone Other (See Comments)    Review of Systems: ROS General: Positive for weight loss (prompting recent CT scan) Cardiac: No chest pain or tightness.  No shortness of breath.  No palpitations. Pulmonary: Frequent dry cough, known COPD. GI negative GU negative Vascular: Has pain in legs with walking and with lying flat. Neuro chronic back pain related to degenerative disc disease Musculoskeletal arthritis, muscle pain, difficulty walking. Skin negative Psychiatric: Positive for depression ENT: Positive for hearing loss Heme: Positive for easy bruising    Physical Exam: Vital signs: BP 130/87 Pulse 94 Respirations 20 SPO2 98% on room air  General: Pleasant 66 year old male who appears his stated age.  He is accompanied by his wife. Neck: No carotid bruits or JVD.  No obvious lymphadenopathy. Chest: Asymmetrical due to severe scoliosis.  Breath sounds are clear to auscultation.  He has a coarse cough while here in the office today. Heart: Regular rate and rhythm, no murmur Abdomen: Soft, nontender, active bowel sounds Extremities: No deformities.  Upper extremities are well-perfused.  Dependent rubor both lower extremities.  He has a palpable left posterior tibial pulse and right dorsalis  pedis pulse. Neuro: Grossly nonfocal  Diagnostic Tests: The CT scan has been imported into PACS.  Noncontrast CT chest 11/09/2021 1.  Scattered small nodules measuring up to 6 mm.  Recommend 67-monthfollow-up chest CT for stability. 2.  Mild right lower lobe mucous plugging and pleural scarring.  Mild scattered bilateral bronchial wall thickening and basilar atelectasis/scarring.  Mild emphysema. 3.  Aneurysmal dilation of the ascending aorta measuring 4.5 cm 4.  Thoracolumbar scoliosis with associated severe multilevel degenerative disc disease  Impression / plan: 66year old male with  degenerative disc disease and chronic back pain, emphysema with ongoing tobacco use is referred with a 4.5 cm ascending aortic aneurysm identified incidentally on recent CT scan of the chest.  This has been asymptomatic.  We discussed the natural history of thoracic aortic aneurysms and expected symptoms that might be related to rapid expansion.  We discussed the importance of careful blood pressure management, avoidance of strenuous activity, and smoking cessation.  We will plan for follow-up in 6 months with CTA chest and an echocardiogram.    MAntony Odea PA-C [facility] [phone]

## 2021-12-27 NOTE — Patient Instructions (Signed)
Begin taking your cholesterol medication as prescribed.  Strongly recommend smoking cessation.  Continue to monitor and manage your blood pressure to maintain less than 130/80.  Avoid strenuous activity. Do not lift, push, or pull more than 40 pounds.  Avoid the fluoroquinolone class of antibiotics such as Cipro or Levaquin.  Follow-up in 6 months with CTA chest and echocardiogram.

## 2022-01-04 ENCOUNTER — Ambulatory Visit: Payer: No Typology Code available for payment source | Admitting: Gastroenterology

## 2022-01-17 NOTE — Progress Notes (Unsigned)
GI Office Note    Referring Provider: Maryanna Shape Primary Care Physician:  Maryanna Shape  Primary Gastroenterologist:  Chief Complaint   No chief complaint on file.    History of Present Illness   Raphael Fitzpatrick is a 66 y.o. male presenting today        ***melanoma s/p resection of lesion on left back but his ***shoulder blade and subsequent left axillary node dissection. ***4.5 ascending aortic aneurysm, surveillance by [facility].  Medications   Current Outpatient Medications  Medication Sig Dispense Refill   cyclobenzaprine (FLEXERIL) 10 MG tablet Take by mouth.     HYDROcodone-acetaminophen (NORCO) 10-325 MG tablet Take by mouth.     methocarbamol (ROBAXIN) 500 MG tablet Take 1 tablet (500 mg total) by mouth 2 (two) times daily as needed. 20 tablet 0   Multiple Vitamins-Minerals (PRESERVISION AREDS 2) CAPS Take 1 capsule by mouth 2 (two) times daily.     naproxen (NAPROSYN) 500 MG tablet Take 1 tablet (500 mg total) by mouth 2 (two) times daily with a meal. 30 tablet 0   venlafaxine XR (EFFEXOR-XR) 150 MG 24 hr capsule Take by mouth.     zolpidem (AMBIEN) 10 MG tablet Take by mouth.     No current facility-administered medications for this visit.    Allergies   Allergies as of 01/18/2022 - Review Complete 12/27/2021  Allergen Reaction Noted   Prednisone Rash 09/18/2019   Ibuprofen  02/11/2013   Morphine Other (See Comments) 01/29/2015   Oxycodone Other (See Comments) 01/29/2015    Past Medical History   Past Medical History:  Diagnosis Date   Back pain    Degenerative disc disease, cervical    Emphysema of lung ([place])    Hearing impaired person, bilateral    Scoliosis     Past Surgical History   Past Surgical History:  Procedure Laterality Date   NASAL SINUS SURGERY      Past Family History   No family history on file.  Past Social History   Social History   Socioeconomic History    Marital status: Single    Spouse name: Not on file   Number of children: Not on file   Years of education: Not on file   Highest education level: Not on file  Occupational History   Not on file  Tobacco Use   Smoking status: Every Day    Packs/day: 0.75    Types: Cigarettes   Smokeless tobacco: Never  Substance and Sexual Activity   Alcohol use: No   Drug use: No   Sexual activity: Not on file  Other Topics Concern   Not on file  Social History Narrative   Not on file   Social Determinants of Health   Financial Resource Strain: Not on file  Food Insecurity: Not on file  Transportation Needs: Not on file  Physical Activity: Not on file  Stress: Not on file  Social Connections: Not on file  Intimate Partner Violence: Not on file    Review of Systems   General: Negative for anorexia, weight loss, fever, chills, fatigue, weakness. Eyes: Negative for vision changes.  ENT: Negative for hoarseness, difficulty swallowing , nasal congestion. CV: Negative for chest pain, angina, palpitations, dyspnea on exertion, peripheral edema.  Respiratory: Negative for dyspnea at rest, dyspnea on exertion, cough, sputum, wheezing.  GI: See history of present illness. GU:  Negative for dysuria, hematuria, urinary incontinence, urinary frequency, nocturnal urination.  MS: Negative for joint pain, low back pain.  Derm: Negative for rash or itching.  Neuro: Negative for weakness, abnormal sensation, seizure, frequent headaches, memory loss,  confusion.  Psych: Negative for anxiety, depression, suicidal ideation, hallucinations.  Endo: Negative for unusual weight change.  Heme: Negative for bruising or bleeding. Allergy: Negative for rash or hives.  Physical Exam   There were no vitals taken for this visit.   General: Well-nourished, well-developed in no acute distress.  Head: Normocephalic, atraumatic.   Eyes: Conjunctiva pink, no icterus. Mouth: Oropharyngeal mucosa moist and pink ,  no lesions erythema or exudate. Neck: Supple without thyromegaly, masses, or lymphadenopathy.  Lungs: Clear to auscultation bilaterally.  Heart: Regular rate and rhythm, no murmurs rubs or gallops.  Abdomen: Bowel sounds are normal, nontender, nondistended, no hepatosplenomegaly or masses,  no abdominal bruits or hernia, no rebound or guarding.   Rectal: *** Extremities: No lower extremity edema. No clubbing or deformities.  Neuro: Alert and oriented x 4 , grossly normal neurologically.  Skin: Warm and dry, no rash or jaundice.   Psych: Alert and cooperative, normal mood and affect.  Labs   *** Imaging Studies   No results found.  Assessment       PLAN   ***   Laureen Ochs. Bobby Rumpf, [place], [facility]

## 2022-01-18 ENCOUNTER — Ambulatory Visit (INDEPENDENT_AMBULATORY_CARE_PROVIDER_SITE_OTHER): Payer: No Typology Code available for payment source | Admitting: Gastroenterology

## 2022-01-18 ENCOUNTER — Encounter: Payer: Self-pay | Admitting: Gastroenterology

## 2022-01-18 DIAGNOSIS — R634 Abnormal weight loss: Secondary | ICD-10-CM | POA: Diagnosis not present

## 2022-01-18 NOTE — Progress Notes (Signed)
Additional labs reviewed from 09/2021: WBC 11,880, TSH 2.13, A1C 6.2. Await Chest CT report. It appears that CT A/P has not been done.

## 2022-01-18 NOTE — Patient Instructions (Signed)
I will be in touch with further recommendations once we receive additional records from the [facility].

## 2022-02-02 ENCOUNTER — Telehealth: Payer: Self-pay

## 2022-02-02 NOTE — Telephone Encounter (Signed)
CT Thorax report received from [facility] for review under media dated 11/09/2021.

## 2022-02-02 NOTE — Telephone Encounter (Signed)
Reviewed available labs, see office note.  Reviewed CT chest report from June 2023: He had scattered small nodules measuring up to 6 mm, 61-monthfollow-up chest CT for stability recommended.  Mild right lower lobe mucous plugging and pleural scarring.  Mild emphysema.  Mild scattered bilateral bronchial wall thickening.  Aneurysm dilatation of the ascending aorta measuring 4.5 cm.  Thoracolumbar scoliosis with associated severe multilevel degenerative disc disease.  Also on CT noted to have severe fatty atrophy of the pancreas. He has no GI symptoms such as abdominal pain or diarrhea.   [facility].  Recommend colonoscopy with Dr. CAbbey Chatters  ASA 3 If colonoscopy is unremarkable, next step would be CT A/P with contrast to further evaluate weight loss.

## 2022-02-02 NOTE — Telephone Encounter (Signed)
Lmom for pt to return my call.  

## 2022-02-03 NOTE — Telephone Encounter (Signed)
Pt was made aware and is willing to move forward with colonoscopy. Pt has started to gain some weight.

## 2022-02-07 ENCOUNTER — Encounter: Payer: Self-pay | Admitting: *Deleted

## 2022-02-07 MED ORDER — PEG-KCL-NACL-NASULF-NA ASC-C 100 G PO SOLR: 1.0000 | Freq: Once | ORAL | 0 refills | Status: AC

## 2022-02-07 NOTE — Telephone Encounter (Signed)
Called pt. Scheduled for 10/2 at 8:30am with Dr. Abbey Chatters. He requested rx for prep to be sent to [facility].  Fowarding to Manuela Schwartz to have the [place] add the facility to [place] and fax this updated auth over.

## 2022-02-24 ENCOUNTER — Encounter (HOSPITAL_COMMUNITY): Payer: Self-pay

## 2022-02-24 ENCOUNTER — Encounter (HOSPITAL_COMMUNITY)
Admission: RE | Admit: 2022-02-24 | Discharge: 2022-02-24 | Disposition: A | Discharge status: Home / Self Care | Payer: No Typology Code available for payment source | Source: Ambulatory Visit | Attending: Internal Medicine | Admitting: Internal Medicine

## 2022-02-24 NOTE — Patient Instructions (Signed)
    Kirk Bell  02/24/2022     '@PREFPERIOPPHARMACY'$ @   Your procedure is scheduled on Monday, 02/28/22.  Report to [facility] at [phone] A.M.  Call this number if you have problems the morning of surgery:  [phone]   Remember:  Do not eat or drink after midnight.      Take these medicines the morning of surgery with A SIP OF WATER effexor and hydrocodone    Do not wear jewelry, make-up or nail polish.  Do not wear lotions, powders, or perfumes, or deodorant.  Do not shave 48 hours prior to surgery.  Men may shave face and neck.  Do not bring valuables to the hospital.  [facility] is not responsible for any belongings or valuables.  Contacts, dentures or bridgework may not be worn into surgery.  Leave your suitcase in the car.  After surgery it may be brought to your room.  For patients admitted to the hospital, discharge time will be determined by your treatment team.  Patients discharged the day of surgery will not be allowed to drive home.   Name and phone number of your driver:   sister in law Special instructions:  Follow diet and prep instructions in the letter from the office.  Please read over the following fact sheets that you were given. Care and Recovery After Surgery

## 2022-02-25 ENCOUNTER — Encounter: Payer: Self-pay | Admitting: *Deleted

## 2022-03-23 ENCOUNTER — Encounter (HOSPITAL_COMMUNITY): Payer: Self-pay

## 2022-03-23 ENCOUNTER — Encounter (HOSPITAL_COMMUNITY)
Admission: RE | Admit: 2022-03-23 | Discharge: 2022-03-23 | Disposition: A | Discharge status: Home / Self Care | Payer: No Typology Code available for payment source | Source: Ambulatory Visit | Attending: Internal Medicine | Admitting: Internal Medicine

## 2022-03-23 HISTORY — DX: Other nonspecific abnormal finding of lung field: R91.8

## 2022-03-25 ENCOUNTER — Telehealth: Payer: Self-pay | Admitting: *Deleted

## 2022-03-25 NOTE — Telephone Encounter (Signed)
Pt called to cancel procedure for Monday. He states his PCP told him to cancel due to him having an aneurysm. He states he will call back once PCP clears him. Procedure cancelled.

## 2022-03-28 ENCOUNTER — Ambulatory Visit (HOSPITAL_COMMUNITY): Admission: RE | Admit: 2022-03-28 | Payer: No Typology Code available for payment source | Source: Home / Self Care

## 2022-03-28 ENCOUNTER — Encounter (HOSPITAL_COMMUNITY): Admission: RE | Payer: Self-pay | Source: Home / Self Care

## 2022-03-28 SURGERY — COLONOSCOPY WITH PROPOFOL
Anesthesia: Monitor Anesthesia Care

## 2022-04-19 ENCOUNTER — Other Ambulatory Visit (HOSPITAL_COMMUNITY): Payer: Self-pay | Admitting: Internal Medicine

## 2022-04-19 DIAGNOSIS — Z0189 Encounter for other specified special examinations: Secondary | ICD-10-CM

## 2022-04-27 ENCOUNTER — Ambulatory Visit (HOSPITAL_COMMUNITY)
Admission: RE | Admit: 2022-04-27 | Discharge: 2022-04-27 | Disposition: A | Discharge status: Home / Self Care | Payer: No Typology Code available for payment source | Source: Ambulatory Visit | Attending: Internal Medicine | Admitting: Internal Medicine

## 2022-04-27 DIAGNOSIS — Z0189 Encounter for other specified special examinations: Secondary | ICD-10-CM | POA: Diagnosis not present

## 2022-06-22 ENCOUNTER — Other Ambulatory Visit: Payer: Self-pay | Admitting: Surgery

## 2022-06-22 DIAGNOSIS — I712 Thoracic aortic aneurysm, without rupture, unspecified: Secondary | ICD-10-CM

## 2022-07-12 ENCOUNTER — Ambulatory Visit (HOSPITAL_COMMUNITY)
Admission: RE | Admit: 2022-07-12 | Discharge: 2022-07-12 | Disposition: A | Discharge status: Home / Self Care | Payer: No Typology Code available for payment source | Source: Ambulatory Visit | Attending: Surgery | Admitting: Surgery

## 2022-07-12 DIAGNOSIS — I712 Thoracic aortic aneurysm, without rupture, unspecified: Secondary | ICD-10-CM | POA: Diagnosis present

## 2022-07-12 LAB — ECHOCARDIOGRAM COMPLETE
AR max vel: 3.59 cm2
AV Area VTI: 3.42 cm2
AV Area mean vel: 3.63 cm2
AV Mean grad: 4.5 mmHg
AV Peak grad: 9.5 mmHg
Ao pk vel: 1.54 m/s
Area-P 1/2: 2.01 cm2
S' Lateral: 2.7 cm

## 2022-07-12 NOTE — Progress Notes (Signed)
*  PRELIMINARY RESULTS* Echocardiogram 2D Echocardiogram has been performed.  Samuel Germany 07/12/2022, 11:38 AM

## 2022-07-27 ENCOUNTER — Ambulatory Visit (INDEPENDENT_AMBULATORY_CARE_PROVIDER_SITE_OTHER): Payer: No Typology Code available for payment source | Admitting: Surgery

## 2022-07-27 ENCOUNTER — Encounter: Payer: Self-pay | Admitting: Surgery

## 2022-07-27 ENCOUNTER — Ambulatory Visit
Admission: RE | Admit: 2022-07-27 | Discharge: 2022-07-27 | Disposition: A | Discharge status: Home / Self Care | Payer: No Typology Code available for payment source | Source: Ambulatory Visit | Attending: Surgery | Admitting: Surgery

## 2022-07-27 VITALS — BP 125/73 | HR 74 | Resp 20 | Wt 160.0 lb

## 2022-07-27 DIAGNOSIS — I712 Thoracic aortic aneurysm, without rupture, unspecified: Secondary | ICD-10-CM | POA: Diagnosis not present

## 2022-07-27 MED ORDER — IOPAMIDOL (ISOVUE-370) INJECTION 76%
75.0000 mL | Freq: Once | INTRAVENOUS | Status: AC | PRN
  Administered 2022-07-27: 75 mL via INTRAVENOUS

## 2022-07-27 NOTE — Progress Notes (Signed)
HPI:  The patient is a 67 year old gentleman with a history of 1 pack/day smoking who returns for follow-up of a 4.5 cm fusiform ascending aortic aneurysm.  He has a history of chronic back pain due to degenerative disc disease and scoliosis.  He denies any chest pain or pressure.  He has had no shortness of breath.  Current Outpatient Medications  Medication Sig Dispense Refill   atorvastatin (LIPITOR) 10 MG tablet Take 5 mg by mouth at bedtime.     baclofen (LIORESAL) 10 MG tablet Take 10 mg by mouth 4 (four) times daily.     Carboxymethylcellulose Sod PF 0.5 % SOLN Apply 1 drop to eye 4 (four) times daily.     cycloSPORINE (RESTASIS) 0.05 % ophthalmic emulsion Place 1 drop into both eyes every 12 (twelve) hours.     diphenhydrAMINE (BENADRYL) 25 mg capsule Take 25 mg by mouth at bedtime.     fluticasone-salmeterol (ADVAIR HFA) 115-21 MCG/ACT inhaler Inhale 1 puff into the lungs 2 (two) times daily.     HYDROcodone-acetaminophen (NORCO) 10-325 MG tablet Take 1 tablet by mouth in the morning, at noon, in the evening, and at bedtime.     Multiple Vitamins-Minerals (PRESERVISION AREDS 2) CAPS Take 1 capsule by mouth 2 (two) times daily.     naloxone (NARCAN) nasal spray 4 mg/0.1 mL Place 1 spray into the nose once.     Tiotropium Bromide Monohydrate 2.5 MCG/ACT AERS Inhale 1 each into the lungs daily.     venlafaxine (EFFEXOR) 100 MG tablet Take 100 mg by mouth 2 (two) times daily.     zolpidem (AMBIEN) 10 MG tablet Take 10 mg by mouth at bedtime as needed for sleep.     lidocaine (LIDODERM) 5 % Place 1 patch onto the skin daily. (Patient not taking: Reported on 07/27/2022)     No current facility-administered medications for this visit.     Physical Exam: BP 125/73   Pulse 74   Resp 20   Wt 160 lb (72.6 kg)   SpO2 95%   BMI 22.96 kg/m  He looks well. Cardiac exam shows a regular rate and rhythm with normal heart sounds.  There is no murmur. Lungs are clear.   Diagnostic  Tests:  ECHOCARDIOGRAM REPORT       Patient Name:   Kirk Bell Date of Exam: 07/12/2022  Medical Rec #:  [id number]      Height:       70.0 in  Accession #:    [id number]     Weight:       161.0 lb  Date of Birth:  09/03/1955       BSA:          1.903 m  Patient Age:    64 years       BP:           155/82 mmHg  Patient Gender: M              HR:           66 bpm.  Exam Location:  [facility]   Procedure: 2D Echo, Cardiac Doppler and Color Doppler   Indications:    Aortic valve disorder I35.9    History:        Patient has no prior history of Echocardiogram  examinations.                 Risk Factors:Current Smoker. Emphysema of lung ([place]) (From  Hx),  Ascending aortic aneurysm ([facility]) (From Hx).    Sonographer:    Alvino Chapel RCS  Referring Phys: [id number]     1. Left ventricular ejection fraction, by estimation, is 65 to 70%. The  left ventricle has normal function. The left ventricle has no regional  wall motion abnormalities. There is mild concentric left ventricular  hypertrophy. Left ventricular diastolic  parameters are consistent with Grade I diastolic dysfunction (impaired  relaxation).   2. Right ventricular systolic function is normal. The right ventricular  size is normal. Tricuspid regurgitation signal is inadequate for assessing  PA pressure.   3. The mitral valve is grossly normal. Trivial mitral valve  regurgitation.   4. The aortic valve is tricuspid. Aortic valve regurgitation is not  visualized. No aortic stenosis is present. Aortic valve mean gradient  measures 4.5 mmHg.   5. Aortic dilatation noted. There is moderate dilatation of the aortic  root, measuring 45 mm. There is mild dilatation of the ascending aorta,  measuring 41 mm.   6. The inferior vena cava is normal in size with greater than 50%  respiratory variability, suggesting right atrial pressure of 3 mmHg.   Comparison(s): No prior Echocardiogram.    FINDINGS   Left Ventricle: Left ventricular ejection fraction, by estimation, is 65  to 70%. The left ventricle has normal function. The left ventricle has no  regional wall motion abnormalities. The left ventricular internal cavity  size was normal in size. There is   mild concentric left ventricular hypertrophy. Left ventricular diastolic  parameters are consistent with Grade I diastolic dysfunction (impaired  relaxation).   Right Ventricle: The right ventricular size is normal. No increase in  right ventricular wall thickness. Right ventricular systolic function is  normal. Tricuspid regurgitation signal is inadequate for assessing PA  pressure.   Left Atrium: Left atrial size was normal in size.   Right Atrium: Right atrial size was normal in size.   Pericardium: There is no evidence of pericardial effusion.   Mitral Valve: The mitral valve is grossly normal. Trivial mitral valve  regurgitation.   Tricuspid Valve: The tricuspid valve is grossly normal. Tricuspid valve  regurgitation is trivial.   Aortic Valve: The aortic valve is tricuspid. Aortic valve regurgitation is  not visualized. No aortic stenosis is present. Aortic valve mean gradient  measures 4.5 mmHg. Aortic valve peak gradient measures 9.5 mmHg. Aortic  valve area, by VTI measures 3.42  cm.   Pulmonic Valve: The pulmonic valve was grossly normal. Pulmonic valve  regurgitation is trivial.   Aorta: Aortic dilatation noted. There is moderate dilatation of the aortic  root, measuring 45 mm. There is mild dilatation of the ascending aorta,  measuring 41 mm.   Venous: The inferior vena cava is normal in size with greater than 50%  respiratory variability, suggesting right atrial pressure of 3 mmHg.   IAS/Shunts: No atrial level shunt detected by color flow Doppler.     LEFT VENTRICLE  PLAX 2D  LVIDd:         4.60 cm   Diastology  LVIDs:         2.70 cm   LV e' medial:    8.27 cm/s  LV PW:         1.10  cm   LV E/e' medial:  7.4  LV IVS:        1.10 cm   LV e' lateral:   8.81 cm/s  LVOT diam:  2.30 cm   LV E/e' lateral: 6.9  LV SV:         111  LV SV Index:   58  LVOT Area:     4.15 cm     RIGHT VENTRICLE  TAPSE (M-mode): 2.4 cm   LEFT ATRIUM             Index        RIGHT ATRIUM           Index  LA diam:        3.40 cm 1.79 cm/m   RA Area:     20.80 cm  LA Vol (A2C):   43.1 ml 22.64 ml/m  RA Volume:   50.10 ml  26.32 ml/m  LA Vol (A4C):   56.6 ml 29.73 ml/m  LA Biplane Vol: 54.4 ml 28.58 ml/m   AORTIC VALVE  AV Area (Vmax):    3.59 cm  AV Area (Vmean):   3.63 cm  AV Area (VTI):     3.42 cm  AV Vmax:           154.00 cm/s  AV Vmean:          96.150 cm/s  AV VTI:            0.325 m  AV Peak Grad:      9.5 mmHg  AV Mean Grad:      4.5 mmHg  LVOT Vmax:         133.00 cm/s  LVOT Vmean:        84.050 cm/s  LVOT VTI:          0.268 m  LVOT/AV VTI ratio: 0.82    AORTA  Ao Root diam: 4.45 cm   MITRAL VALVE  MV Area (PHT): 2.01 cm    SHUNTS  MV Decel Time: 377 msec    Systemic VTI:  0.27 m  MV E velocity: 61.00 cm/s  Systemic Diam: 2.30 cm  MV A velocity: 76.10 cm/s  MV E/A ratio:  0.80   Rozann Lesches MD  Electronically signed by Rozann Lesches MD  Signature Date/Time: 07/12/2022/11:46:43 AM        Final      Narrative & Impression  CLINICAL DATA:  Thoracic aortic aneurysm.   EXAM: CT ANGIOGRAPHY CHEST WITH CONTRAST   TECHNIQUE: Multidetector CT imaging of the chest was performed using the standard protocol during bolus administration of intravenous contrast. Multiplanar CT image reconstructions and MIPs were obtained to evaluate the vascular anatomy.   RADIATION DOSE REDUCTION: This exam was performed according to the departmental dose-optimization program which includes automated exposure control, adjustment of the mA and/or kV according to patient size and/or use of iterative reconstruction technique.   CONTRAST:  29m ISOVUE-370 IOPAMIDOL  (ISOVUE-370) INJECTION 76%   COMPARISON:  November 09, 2021 at outside facility.   FINDINGS: Cardiovascular: 4.4 cm ascending thoracic aortic aneurysm is noted. Tortuosity of descending thoracic aorta is noted. Normal cardiac size. No pericardial effusion.   Mediastinum/Nodes: No enlarged mediastinal, hilar, or axillary lymph nodes. Thyroid gland, trachea, and esophagus demonstrate no significant findings.   Lungs/Pleura: No pneumothorax or pleural effusion is noted. Calcified pleural plaque is noted posteriorly in right lung base. Probable right basilar scarring is noted. No definite acute abnormality is noted.   Upper Abdomen: No acute abnormality.   Musculoskeletal: Dextroscoliosis of lower thoracic and lumbar spine is noted. No acute osseous abnormality is noted.   Review of the MIP images confirms the above  findings.   IMPRESSION: 4.4 cm ascending thoracic aortic aneurysm. Recommend annual imaging followup by CTA or MRA. This recommendation follows 2010 ACCF/AHA/AATS/ACR/ASA/SCA/SCAI/SIR/STS/SVM Guidelines for the Diagnosis and Management of Patients with Thoracic Aortic Disease. Circulation. 2010; [id number]. Aortic aneurysm NOS (ICD10-I71.9).   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 There is associated right basilar scarring.   Dextroscoliosis of lower thoracic and lumbar spine.     Electronically Signed   By: Marijo Conception M.D.   On: 07/27/2022 14:31     Impression:  This 67 year old gentleman has a stable 4.4 cm fusiform ascending aortic aneurysm with a normal trileaflet aortic valve without stenosis or insufficiency.  This is well below the surgical threshold of 5.5 cm.  I reviewed the CTA images with the patient and his wife.  I discussed the importance of continued good blood pressure control in preventing further enlargement and acute aortic dissection.  I advised him against doing any  heavy lifting that may require a Valsalva maneuver and could suddenly raise his blood pressure to high levels.  I also advised him to discontinue smoking.  Plan:  He will return to see me in 1 year with a CTA of the chest.  I spent 15 minutes performing this established patient evaluation and > 50% of this time was spent face to face counseling and coordinating the care of this patient's aortic aneurysm.    Gaye Pollack, MD [facility] [phone]

## 2022-09-14 NOTE — Progress Notes (Unsigned)
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 States that he has occasional SOB and coughing.  Dyspnea:  yardwork fine / back is limiting (kyphoscoliosis)  Cough: x xmas 2023 dry worse p stirs but wakes him up as well  Sleep: bed flat one pillow on side  SABA use: spiriva advair  02: none  Delsym works the best, alos takes norco for chronic back pain but not at max level  No obvious day to day or daytime pattern/variability or assoc excess/ purulent sputum or mucus plugs or hemoptysis or cp or chest tightness, subjective wheeze or overt sinus or hb symptoms.     Also denies any obvious fluctuation of symptoms with weather or environmental changes or other aggravating or alleviating factors except as outlined above   No unusual exposure hx or h/o childhood pna/ asthma or knowledge of premature birth.  Current Allergies, Complete Past Medical History, Past Surgical History, Family History, and Social History were reviewed in [employer] record.  ROS  The following are not active complaints unless bolded Hoarseness/gruff voice , sore throat, dysphagia, dental problems, itching, sneezing,  nasal congestion or discharge of excess mucus or purulent secretions, ear ache,   fever, chills, sweats, unintended wt loss or wt gain, classically pleuritic or exertional cp,  orthopnea pnd or arm/hand swelling  or leg swelling, presyncope, palpitations, abdominal pain, anorexia, nausea, vomiting, diarrhea  or change  in bowel habits or change in bladder habits, change in stools or change in urine, dysuria, hematuria,  rash, arthralgias, visual complaints, headache, numbness, weakness or ataxia or problems with walking or coordination,  change in mood or  memory.             Past Medical History:  Diagnosis Date   Ascending aortic aneurysm    4.5cm under surveillance by [facility]   Back pain    Degenerative disc disease, cervical    Emphysema of lung    Hearing impaired person, bilateral    Lung nodules    Melanoma    Scoliosis     Outpatient Medications Prior to Visit  Medication Sig Dispense Refill   baclofen (LIORESAL) 10 MG tablet Take 10 mg by mouth 4 (four) times daily.     Carboxymethylcellulose Sod PF 0.5 % SOLN Apply 1 drop to eye 4 (four) times daily.     cycloSPORINE (RESTASIS) 0.05 % ophthalmic emulsion Place 1 drop into both eyes every 12 (twelve) hours.     diphenhydrAMINE (BENADRYL) 25 mg capsule Take 25 mg by mouth at bedtime.     fluticasone-salmeterol (ADVAIR HFA) 115-21 MCG/ACT inhaler Inhale 1 puff into the lungs 2 (two) times daily.     HYDROcodone-acetaminophen (NORCO) 10-325 MG tablet Take 1 tablet by mouth in the morning, at noon, in the evening, and at bedtime.     Multiple Vitamins-Minerals (PRESERVISION AREDS 2) CAPS Take 1 capsule by mouth 2 (two) times daily.  naloxone (NARCAN) nasal spray 4 mg/0.1 mL Place 1 spray into the nose once.     Tiotropium Bromide Monohydrate 2.5 MCG/ACT AERS Inhale 1 each into the lungs daily.     venlafaxine (EFFEXOR) 100 MG tablet Take 100 mg by mouth 2 (two) times daily.     zolpidem (AMBIEN) 10 MG tablet Take 10 mg by mouth at bedtime as needed for sleep.     atorvastatin (LIPITOR) 10 MG tablet Take 5 mg by mouth at bedtime. (Patient not taking: Reported on 09/15/2022)     lidocaine (LIDODERM) 5 % Place 1 patch onto the skin daily. (Patient not taking: Reported on 07/27/2022)     No facility-administered  medications prior to visit.     Objective:     BP 127/82   Pulse 65   Ht 5\' 9"  (1.753 m)   Wt 159 lb 12.8 oz (72.5 kg)   SpO2 96% Comment: RA  BMI 23.60 kg/m   SpO2: 96 % (RA)   HEENT : Oropharynx  clear/ poor lower dentition   Nasal turbinates nl    NECK :  without  apparent JVD/ palpable Nodes/TM    LUNGS: no acc muscle use,  Min barrel /kyphoscoliosis mod severe contour chest wall with bilateral  slightly decreased bs s audible wheeze and  without cough on insp or exp maneuvers and min  Hyperresonant  to  percussion bilaterally    CV:  RRR  no s3 or murmur or increase in P2, and no edema   ABD:  soft and nontender with pos end  insp Hoover's  in the supine position.  No bruits or organomegaly appreciated   MS:  Nl gait/ ext warm without deformities Or obvious joint restrictions  calf tenderness, cyanosis or clubbing     SKIN: warm and dry without lesions    NEURO:  alert, approp, nl sensorium with  no motor or cerebellar deficits apparent.         I personally reviewed images and agree with radiology impression as follows:   Chest CTa   07/27/22  Calcified pleural plaque is again noted posteriorly in right lung base consistent with asbestos exposure. There is associated right basilar scarring. Dextroscoliosis of lower thoracic and lumbar spine.  My review No significant   emphysema       Assessment   Upper airway cough syndrome Active smoker with new onset cough x late dec 2023 while on dpi advari and smi spiriva - d/c advair 09/15/2022  and contiue spiriva smi   He has minimal copd clinically and cough has much more of a pattern suggestive of Upper airway cough syndrome (previously labeled PNDS),  is so named because it's frequently impossible to sort out how much is  CR/sinusitis with freq throat clearing (which can be related to primary GERD)   vs  causing  secondary (" extra esophageal")  GERD from wide swings in gastric pressure that occur with throat  clearing, often  promoting self use of mint and menthol lozenges that reduce the lower esophageal sphincter tone and exacerbate the problem further in a cyclical fashion.   These are the same pts (now being labeled as having "irritable larynx syndrome" by some cough centers) who not infrequently have a history of having failed to tolerate ace inhibitors,  dry powder inhalers(esp advair)  or biphosphonates or report having atypical/extraesophageal reflux symptoms that don't respond to standard doses of PPI  and are easily confused as having aecopd or asthma flares by even  experienced allergists/ pulmonologists (myself included).  Rec The key is to stop smoking completely before smoking completely stops you!  Stop advair but spiriva 2 puffs 1st thing each am   Pantoprazole (protonix) 40 mg  (prilosec 20 mg x 2)   Take  30-60 min before first meal of the day and Pepcid (famotidine)  20 mg after supper until return to office - this is the best way to tell whether stomach acid is contributing to your problem.    GERD (REFLUX)  is an extremely common cause of respiratory symptoms just like yours , many times with no obvious heartburn at all.    It can be treated with medication, but also with lifestyle changes including elevation of the head of your bed (ideally with 6 -8inch blocks under the headboard of your bed),  Smoking cessation, avoidance of late meals, excessive alcohol, and avoid fatty foods, chocolate, peppermint, colas, red wine, and acidic juices such as orange juice.  NO MINT OR MENTHOL PRODUCTS SO NO COUGH DROPS  USE SUGARLESS CANDY INSTEAD (Jolley ranchers or Stover's or Life Savers) or even ice chips will also do - the key is to swallow to prevent all throat clearing. NO OIL BASED VITAMINS - use powdered substitutes.  Avoid fish oil when coughing.   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     F/u in 6 weeks          Each maintenance medication was reviewed in detail including emphasizing most importantly the difference between maintenance and prns and under what circumstances the prns are to be triggered using an action plan format where appropriate.  Total time for H and P, chart review, counseling, reviewing smi  device(s) and generating customized AVS unique to this office visit / same day charting  > 45 min new pt eval          Sandrea Hughs, MD 09/15/2022

## 2022-09-15 ENCOUNTER — Encounter: Payer: Self-pay | Admitting: Internal Medicine

## 2022-09-15 ENCOUNTER — Ambulatory Visit: Payer: No Typology Code available for payment source | Admitting: Internal Medicine

## 2022-09-15 VITALS — BP 127/82 | HR 65 | Ht 69.0 in | Wt 159.8 lb

## 2022-09-15 DIAGNOSIS — F17210 Nicotine dependence, cigarettes, uncomplicated: Secondary | ICD-10-CM

## 2022-09-15 DIAGNOSIS — R058 Other specified cough: Secondary | ICD-10-CM | POA: Diagnosis not present

## 2022-09-15 DIAGNOSIS — J920 Pleural plaque with presence of asbestos: Secondary | ICD-10-CM

## 2022-09-15 MED ORDER — CARVEDILOL 6.25 MG PO TABS: ORAL_TABLET | ORAL | Status: DC

## 2022-09-15 MED ORDER — PANTOPRAZOLE SODIUM 40 MG PO TBEC: 40.0000 mg | DELAYED_RELEASE_TABLET | Freq: Every day | ORAL | 2 refills | Status: AC

## 2022-09-15 MED ORDER — FAMOTIDINE 20 MG PO TABS: ORAL_TABLET | ORAL | 11 refills | Status: AC

## 2022-09-15 NOTE — Assessment & Plan Note (Signed)
Counseled re importance of smoking cessation but did not meet time criteria for separate billing   °

## 2022-09-15 NOTE — Assessment & Plan Note (Addendum)
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     F/u in 6 weeks          Each maintenance medication was reviewed in detail including emphasizing most importantly the difference between maintenance and prns and under what circumstances the prns are to be triggered using an action plan format where appropriate.  Total time for H and P, chart review, counseling, reviewing smi  device(s) and generating customized AVS unique to this office visit / same day charting  > 45 min new pt eval

## 2022-09-15 NOTE — Patient Instructions (Addendum)
The key is to stop smoking completely before smoking completely stops you!  Stop advair but spiriva 2 puffs 1st thing each am   Pantoprazole (protonix) 40 mg  (prilosec 20 mg x 2)   Take  30-60 min before first meal of the day and Pepcid (famotidine)  20 mg after supper until return to office - this is the best way to tell whether stomach acid is contributing to your problem.    GERD (REFLUX)  is an extremely common cause of respiratory symptoms just like yours , many times with no obvious heartburn at all.    It can be treated with medication, but also with lifestyle changes including elevation of the head of your bed (ideally with 6 -8inch blocks under the headboard of your bed),  Smoking cessation, avoidance of late meals, excessive alcohol, and avoid fatty foods, chocolate, peppermint, colas, red wine, and acidic juices such as orange juice.  NO MINT OR MENTHOL PRODUCTS SO NO COUGH DROPS  USE SUGARLESS CANDY INSTEAD (Jolley ranchers or Stover's or Life Savers) or even ice chips will also do - the key is to swallow to prevent all throat clearing. NO OIL BASED VITAMINS - use powdered substitutes.  Avoid fish oil when coughing.   Continue delsym 2 tsp every 12 hours as needed   Please schedule a follow up office visit in 6 weeks, call sooner if needed

## 2022-09-15 NOTE — Assessment & Plan Note (Signed)
Active smoker with new onset cough x late dec 2023 while on dpi advari and smi spiriva - d/c advair 09/15/2022  and contiue spiriva smi   He has minimal copd clinically and cough has much more of a pattern suggestive of Upper airway cough syndrome (previously labeled PNDS),  is so named because it's frequently impossible to sort out how much is  CR/sinusitis with freq throat clearing (which can be related to primary GERD)   vs  causing  secondary (" extra esophageal")  GERD from wide swings in gastric pressure that occur with throat clearing, often  promoting self use of mint and menthol lozenges that reduce the lower esophageal sphincter tone and exacerbate the problem further in a cyclical fashion.   These are the same pts (now being labeled as having "irritable larynx syndrome" by some cough centers) who not infrequently have a history of having failed to tolerate ace inhibitors,  dry powder inhalers(esp advair)  or biphosphonates or report having atypical/extraesophageal reflux symptoms that don't respond to standard doses of PPI  and are easily confused as having aecopd or asthma flares by even experienced allergists/ pulmonologists (myself included).  Rec The key is to stop smoking completely before smoking completely stops you!  Stop advair but spiriva 2 puffs 1st thing each am   Pantoprazole (protonix) 40 mg  (prilosec 20 mg x 2)   Take  30-60 min before first meal of the day and Pepcid (famotidine)  20 mg after supper until return to office - this is the best way to tell whether stomach acid is contributing to your problem.    GERD (REFLUX)  is an extremely common cause of respiratory symptoms just like yours , many times with no obvious heartburn at all.    It can be treated with medication, but also with lifestyle changes including elevation of the head of your bed (ideally with 6 -8inch blocks under the headboard of your bed),  Smoking cessation, avoidance of late meals, excessive alcohol,  and avoid fatty foods, chocolate, peppermint, colas, red wine, and acidic juices such as orange juice.  NO MINT OR MENTHOL PRODUCTS SO NO COUGH DROPS  USE SUGARLESS CANDY INSTEAD (Jolley ranchers or Stover's or Life Savers) or even ice chips will also do - the key is to swallow to prevent all throat clearing. NO OIL BASED VITAMINS - use powdered substitutes.  Avoid fish oil when coughing.   Continue delsym 2 tsp every 12 hours as needed   Please schedule a follow up office visit in 6 weeks, call sooner if needed

## 2022-11-01 ENCOUNTER — Ambulatory Visit: Payer: No Typology Code available for payment source | Admitting: Internal Medicine

## 2023-06-08 ENCOUNTER — Other Ambulatory Visit: Payer: Self-pay | Admitting: Surgery

## 2023-06-08 DIAGNOSIS — I712 Thoracic aortic aneurysm, without rupture, unspecified: Secondary | ICD-10-CM

## 2023-08-02 ENCOUNTER — Inpatient Hospital Stay: Admission: RE | Admit: 2023-08-02 | Payer: Self-pay | Source: Ambulatory Visit

## 2023-08-02 ENCOUNTER — Ambulatory Visit: Payer: Self-pay | Admitting: Surgery

## 2023-08-21 ENCOUNTER — Other Ambulatory Visit: Payer: Self-pay

## 2023-08-30 ENCOUNTER — Encounter: Payer: Self-pay | Admitting: Surgery

## 2023-08-30 ENCOUNTER — Ambulatory Visit (INDEPENDENT_AMBULATORY_CARE_PROVIDER_SITE_OTHER): Payer: Self-pay | Admitting: Surgery

## 2023-08-30 VITALS — BP 130/78 | HR 80 | Resp 20 | Ht 69.0 in | Wt 163.0 lb

## 2023-08-30 DIAGNOSIS — I712 Thoracic aortic aneurysm, without rupture, unspecified: Secondary | ICD-10-CM | POA: Diagnosis not present

## 2023-08-31 NOTE — Progress Notes (Signed)
 HPI:  The patient is a 68 year old gentleman with a history of 1 pack/day smoking who returns for follow-up of a 4.5 cm fusiform ascending aortic aneurysm. He has a history of chronic back pain due to degenerative disc disease and scoliosis. He denies any chest pain or pressure. He has had no shortness of breath.   Current Outpatient Medications  Medication Sig Dispense Refill   baclofen (LIORESAL) 10 MG tablet Take 10 mg by mouth 4 (four) times daily.     Carboxymethylcellulose Sod PF 0.5 % SOLN Apply 1 drop to eye 4 (four) times daily.     carvedilol (COREG) 6.25 MG tablet One half twice daily     cycloSPORINE (RESTASIS) 0.05 % ophthalmic emulsion Place 1 drop into both eyes every 12 (twelve) hours.     diphenhydrAMINE (BENADRYL) 25 mg capsule Take 25 mg by mouth at bedtime.     famotidine (PEPCID) 20 MG tablet One after supper 30 tablet 11   HYDROcodone-acetaminophen (NORCO) 10-325 MG tablet Take 1 tablet by mouth in the morning, at noon, in the evening, and at bedtime.     Multiple Vitamins-Minerals (PRESERVISION AREDS 2) CAPS Take 1 capsule by mouth 2 (two) times daily.     naloxone (NARCAN) nasal spray 4 mg/0.1 mL Place 1 spray into the nose once.     pantoprazole (PROTONIX) 40 MG tablet Take 1 tablet (40 mg total) by mouth daily. Take 30-60 min before first meal of the day 30 tablet 2   Tiotropium Bromide Monohydrate 2.5 MCG/ACT AERS Inhale 1 each into the lungs daily.     venlafaxine (EFFEXOR) 100 MG tablet Take 100 mg by mouth 2 (two) times daily.     zolpidem (AMBIEN) 10 MG tablet Take 10 mg by mouth at bedtime as needed for sleep.     No current facility-administered medications for this visit.     Physical Exam: BP 130/78   Pulse 80   Resp 20   Ht 5\' 9"  (1.753 m)   Wt 163 lb (73.9 kg)   SpO2 95% Comment: RA  BMI 24.07 kg/m  He looks well. Cardiac exam shows a regular rate and rhythm with normal S1 and S2.  There is no murmur. Lungs are clear. There is no  peripheral edema.  Diagnostic Tests:  Narrative & Impression  CLINICAL DATA:  Thoracic aortic aneurysm.   EXAM: CT ANGIOGRAPHY CHEST WITH CONTRAST   TECHNIQUE: Multidetector CT imaging of the chest was performed using the standard protocol during bolus administration of intravenous contrast. Multiplanar CT image reconstructions and MIPs were obtained to evaluate the vascular anatomy.   RADIATION DOSE REDUCTION: This exam was performed according to the departmental dose-optimization program which includes automated exposure control, adjustment of the mA and/or kV according to patient size and/or use of iterative reconstruction technique.   CONTRAST:  75mL ISOVUE-370 IOPAMIDOL (ISOVUE-370) INJECTION 76%   COMPARISON:  November 09, 2021 at outside facility.   FINDINGS: Cardiovascular: 4.4 cm ascending thoracic aortic aneurysm is noted. Tortuosity of descending thoracic aorta is noted. Normal cardiac size. No pericardial effusion.   Mediastinum/Nodes: No enlarged mediastinal, hilar, or axillary lymph nodes. Thyroid gland, trachea, and esophagus demonstrate no significant findings.   Lungs/Pleura: No pneumothorax or pleural effusion is noted. Calcified pleural plaque is noted posteriorly in right lung base. Probable right basilar scarring is noted. No definite acute abnormality is noted.   Upper Abdomen: No acute abnormality.   Musculoskeletal: Dextroscoliosis of lower thoracic and lumbar spine is noted.  No acute osseous abnormality is noted.   Review of the MIP images confirms the above findings.   IMPRESSION: 4.4 cm ascending thoracic aortic aneurysm. Recommend annual imaging followup by CTA or MRA. This recommendation follows 2010 ACCF/AHA/AATS/ACR/ASA/SCA/SCAI/SIR/STS/SVM Guidelines for the Diagnosis and Management of Patients with Thoracic Aortic Disease. Circulation. 2010; 121: N829-F621. Aortic aneurysm NOS (ICD10-I71.9).   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 There is associated right basilar scarring.   Dextroscoliosis of lower thoracic and lumbar spine.     Electronically Signed   By: Lupita Raider M.D.   On: 07/27/2022 14:31      Impression:  This 68 year old gentleman has a stable 4.4 cm fusiform ascending aortic aneurysm. His aneurysm is still well below the surgical threshold of 5.5 cm.  I reviewed the CT images with him and answered all of his questions.  I stressed the importance of continued good blood pressure control in preventing further enlargement and acute aortic dissection.  I advised him against doing any heavy lifting that may require a Valsalva maneuver and could suddenly raise his blood pressure to high levels.   Plan:  I will see him back in 1 year with a CTA of the chest for aortic surveillance.  I spent 10 minutes performing this established patient evaluation and > 50% of this time was spent face to face counseling and coordinating the care of this patient's aortic aneurysm.    Alleen Borne, MD [facility] [phone]

## 2023-10-20 LAB — LAB REPORT - SCANNED: A1c: 6.5

## 2023-12-18 ENCOUNTER — Ambulatory Visit (INDEPENDENT_AMBULATORY_CARE_PROVIDER_SITE_OTHER): Payer: Self-pay | Admitting: "Endocrinology

## 2023-12-18 ENCOUNTER — Encounter: Payer: Self-pay | Admitting: "Endocrinology

## 2023-12-18 VITALS — BP 108/70 | HR 60 | Ht 69.0 in | Wt 164.6 lb

## 2023-12-18 DIAGNOSIS — I10 Essential (primary) hypertension: Secondary | ICD-10-CM | POA: Insufficient documentation

## 2023-12-18 DIAGNOSIS — E279 Disorder of adrenal gland, unspecified: Secondary | ICD-10-CM | POA: Insufficient documentation

## 2023-12-18 DIAGNOSIS — E1165 Type 2 diabetes mellitus with hyperglycemia: Secondary | ICD-10-CM | POA: Diagnosis not present

## 2023-12-18 NOTE — Progress Notes (Signed)
 Endocrinology Consult Note                                            12/18/2023, 10:30 AM   Subjective:    Patient ID: Kirk Bell, male    DOB: June 09, 1955, PCP Volanda Saupe, MD   Past Medical History:  Diagnosis Date   Ascending aortic aneurysm (HCC)    4.5cm under surveillance by [facility]   Back pain    Degenerative disc disease, cervical    Emphysema of lung (HCC)    Hearing impaired person, bilateral    Lung nodules    Melanoma (HCC)    Scoliosis    Past Surgical History:  Procedure Laterality Date   MELANOMA EXCISION     NASAL SINUS SURGERY     Social History   Socioeconomic History   Marital status: Single    Spouse name: Not on file   Number of children: Not on file   Years of education: Not on file   Highest education level: Not on file  Occupational History   Not on file  Tobacco Use   Smoking status: Every Day    Current packs/day: 0.50    Average packs/day: 0.5 packs/day for 55.5 years (27.8 ttl pk-yrs)    Types: Cigarettes    Start date: 06/04/1968   Smokeless tobacco: Never  Vaping Use   Vaping status: Never Used  Substance and Sexual Activity   Alcohol use: No    Comment: No alcohol since the 90s.   Drug use: Not Currently    Types: Marijuana   Sexual activity: Yes  Other Topics Concern   Not on file  Social History Narrative   Not on file   Social Drivers of Health   Financial Resource Strain: Not on file  Food Insecurity: Not on file  Transportation Needs: Not on file  Physical Activity: Not on file  Stress: Not on file  Social Connections: Not on file   Family History  Problem Relation Age of Onset   Leukemia Mother        hairy cell leukemia   Other Father        polio   Liver disease Brother        heavy etoh use   Heart disease Brother    Emphysema Brother    Colon cancer Neg Hx    Outpatient Encounter Medications as of 12/18/2023  Medication Sig   atorvastatin (LIPITOR) 20 MG  tablet Take 20 mg by mouth daily.   carvedilol  (COREG ) 12.5 MG tablet Take 6.25 mg by mouth 2 (two) times daily with a meal.   cyclobenzaprine (FLEXERIL) 10 MG tablet Take 10 mg by mouth in the morning, at noon, in the evening, and at bedtime.   HYDROcodone -acetaminophen  (NORCO) 10-325 MG tablet Take 1 tablet by mouth in the morning, at noon, in the evening, and at bedtime. (Patient taking differently: Take 1 tablet by mouth in the morning, at noon, in the evening, and at bedtime.)   hydrocortisone 2.5 % cream Apply topically.   Melatonin 10 MG TABS Take 1 tablet by mouth as needed.   baclofen (LIORESAL) 10 MG tablet Take 10 mg by mouth 4 (four) times daily. (Patient not taking: Reported on 12/18/2023)   Carboxymethylcellulose Sod PF 0.5 % SOLN Apply 1 drop to eye 4 (four)  times daily.   cycloSPORINE (RESTASIS) 0.05 % ophthalmic emulsion Place 1 drop into both eyes every 12 (twelve) hours.   diphenhydrAMINE (BENADRYL) 25 mg capsule Take 25 mg by mouth at bedtime.   famotidine  (PEPCID ) 20 MG tablet One after supper (Patient not taking: Reported on 12/18/2023)   Multiple Vitamins-Minerals (PRESERVISION AREDS 2) CAPS Take 1 capsule by mouth 2 (two) times daily.   naloxone (NARCAN) nasal spray 4 mg/0.1 mL Place 1 spray into the nose once.   pantoprazole  (PROTONIX ) 40 MG tablet Take 1 tablet (40 mg total) by mouth daily. Take 30-60 min before first meal of the day   Tiotropium Bromide Monohydrate 2.5 MCG/ACT AERS Inhale 1 each into the lungs daily.   venlafaxine (EFFEXOR) 100 MG tablet Take 100 mg by mouth 2 (two) times daily.   zolpidem (AMBIEN) 10 MG tablet Take 10 mg by mouth at bedtime as needed for sleep.   [DISCONTINUED] carvedilol  (COREG ) 6.25 MG tablet One half twice daily   No facility-administered encounter medications on file as of 12/18/2023.   ALLERGIES: Allergies  Allergen Reactions   Prednisone Rash    blisters   Gabapentin     Other reaction(s): Psychotic disorder   Ibuprofen  Diarrhea   Morphine Other (See Comments)   Naproxen  Nausea And Vomiting   Oxycodone Other (See Comments)    VACCINATION STATUS:  There is no immunization history on file for this patient.  HPI Kirk Bell is 68 y.o. male who presents today with a medical history as above. he is being seen in consultation for left adrenal nodule requested by Volanda Saupe, MD.   He is accompanied by his sister-in-law to clinic.  History is obtained from the family as well as his chart review. He has a long and complicated medical history, has been in the [place] care system for years. She is a smoker and has history of EtOH abuse.  He was being followed for pulmonary nodules with various imaging studies over the years.  Since 2020, he was observed to have some irregularities in his left adrenal gland.  CT abdomen and pelvis with IV contrast in October 2023 showed that left adrenal showing diffuse thickening which appeared to be stable from prior study.  From that study, the right adrenal was hyperplastic.  MRI of thorax and abdomen in July 2024 showed 3 cm left adrenal nodule which appeared compatible with an adenoma. Most recent imaging from May 2025 showed marked adenomatous changes.  Right adrenal gland appeared unremarkable. He denies any prior history of adrenal, thyroid, pituitary dysfunctions. Other medical problems include history of melanoma, COPD, degenerative joint disease, scoliosis, ascending aortic aneurysm, hyperlipidemia, pain syndrome, mood disorders, type 2 diabetes.  His most recent A1c was 6.5%.  He is not on medication for diabetes. He continues to smoke. His current medications include atorvastatin, carvedilol  6.25 mg p.o. twice daily, Norco, bronchodilators, Effexor, Narcan, Ambien. He is interested to have appropriate endocrine workup for his adrenal nodule.   Review of Systems  Constitutional: + Progressively regaining the weight he previously lost.  His highest weight has been  175 pounds.  + fatigue, no subjective hyperthermia, no subjective hypothermia Eyes: no blurry vision, no xerophthalmia ENT: no sore throat, no nodules palpated in throat, no dysphagia/odynophagia, no hoarseness Cardiovascular: no Chest Pain, no Shortness of Breath, no palpitations, no leg swelling Respiratory: no cough, no shortness of breath Gastrointestinal: no Nausea/Vomiting/Diarhhea Musculoskeletal: + muscle/joint aches Skin: no rashes Neurological: no tremors, no numbness, no tingling, no dizziness  Psychiatric: no depression, no anxiety  Objective:       12/18/2023    9:31 AM 08/30/2023    4:08 PM 09/15/2022   10:06 AM  Vitals with BMI  Height 5' 9 5' 9 5' 9  Weight 164 lbs 10 oz 163 lbs 159 lbs 13 oz  BMI 24.3 24.06 23.59  Systolic 108 130 872  Diastolic 70 78 82  Pulse 60 80 65    BP 108/70   Pulse 60   Ht 5' 9 (1.753 m)   Wt 164 lb 9.6 oz (74.7 kg)   BMI 24.31 kg/m   Wt Readings from Last 3 Encounters:  12/18/23 164 lb 9.6 oz (74.7 kg)  08/30/23 163 lb (73.9 kg)  09/15/22 159 lb 12.8 oz (72.5 kg)    Physical Exam  Constitutional:  Body mass index is 24.31 kg/m.,  not in acute distress, normal state of mind Eyes: PERRLA, EOMI, no exophthalmos ENT: moist mucous membranes, no gross thyromegaly, no gross cervical lymphadenopathy Cardiovascular: normal precordial activity, Regular Rate and Rhythm, no Murmur/Rubs/Gallops Respiratory:  adequate breathing efforts, no gross chest deformity, Clear to auscultation bilaterally Gastrointestinal: abdomen soft, Non -tender, No distension, Bowel Sounds present, no gross organomegaly Musculoskeletal: + Marked scoliosis of vertebral spine, narrowed rib cage to pelvic crest , no localized tenderness on his spine,  strength intact in all four extremities, no peripheral edema Skin: moist, warm, no rashes Neurological: no tremor with outstretched hands, Deep tendon reflexes normal in bilateral lower extremities.  CMP ( most  recent) CMP     Component Value Date/Time   NA 137 09/18/2019 0916   K 4.0 09/18/2019 0916   CL 102 09/18/2019 0916   CO2 26 09/18/2019 0916   GLUCOSE 121 (H) 09/18/2019 0916   BUN 19 09/18/2019 0916   CREATININE 0.80 09/18/2019 0916   CALCIUM 8.7 (L) 09/18/2019 0916   PROT 6.2 11/08/2010 0603   ALBUMIN 3.2 (L) 11/08/2010 0603   AST 14 11/08/2010 0603   ALT 13 11/08/2010 0603   ALKPHOS 73 11/08/2010 0603   BILITOT 0.2 (L) 11/08/2010 0603   GFRNONAA >60 09/18/2019 0916     Lab Results  Component Value Date   TSH 1.453 11/07/2010      Assessment & Plan:   1. Adrenal nodule (HCC)  2. Type 2 diabetes mellitus with hyperglycemia 3. Essential hypertension, benign  - Woodruff Trembath  is being seen at a kind request of Volanda Saupe, MD. - I have reviewed his available  records and clinically evaluated the patient. - Based on these reviews, he has poorly described left adrenal nodule/nodularity,  however,  there is not sufficient information to proceed with definitive treatment plan.  I discussed with him the goals of evaluating adrenal nodule including neoplastic process and functional abnormalities. He will be sent to lab for the following labs: - Aldosterone + renin activity w/ ratio - 24-hour urine free cortisol with creatinine  -In light of the fact that his previous imaging studies were not directed to the adrenals, he is offered CT adrenal with and without contrast for better characterization of the previously documented adrenal nodule/nodularity.  -This patient with significant kyphoscoliosis, EtOH/smoking, height loss, would benefit from screening for osteoporosis.  I discussed and ordered bone density with vertebral fracture assessment for him to complete the next several weeks. His labs will also include 25-hydroxy vitamin D, thyroid function test.   Regarding his type 2 diabetes, likely responding to his recent weight loss.  His recent  A1c was 6.5%, improving  from 8.3% documented in his records.   He will not need intervention with medications for now.   - he acknowledges that there is a room for improvement in his food and drink choices. - Suggestion is made for him to avoid simple carbohydrates  from his diet including Cakes, Sweet Desserts, Ice Cream, Soda (diet and regular), Sweet Tea, Candies, Chips, Cookies, Store Bought Juices, Alcohol , Artificial Sweeteners,  Coffee Creamer, and Sugar-free Products, Lemonade. This will help patient to have more stable blood glucose profile and potentially avoid unintended weight gain.  -His further treatment for left adrenal nodule will depend on findings of imaging and lab work.  She is encouraged to continue follow-up with his other specialist and PMD. - he is advised to maintain close follow up with Volanda Saupe, MD for primary care needs.   -Thank you for involving me in the care of this pleasant patient.  Time spent with the patient: 60  minutes spent in  counseling him about nodule/nodularity, type 2 diabetes and the rest in obtaining information about his symptoms, reviewing his previous labs/studies (including abstractions from other facilities),  evaluations, and treatments,  and developing a plan to confirm diagnosis and long term treatment based on the latest standards of care/guidelines; and documenting his care.  Conrado Icard participated in the discussions, expressed understanding, and voiced agreement with the above plans.  All questions were answered to his satisfaction. he is encouraged to contact clinic should he have any questions or concerns prior to his return visit.  Follow up plan: Return in about 4 weeks (around 01/15/2024), or and CT abd/Adrenals, for F/U with Pre-visit Labs, 24 Hr Urine Free Cortisol & Cr, A1c -NV, DXA Scan B4 NV.   Ranny Earl, MD [facility] [address] [place], [place] [address] Phone:  [phone]  Fax: [phone]     12/18/2023, 10:30 AM  This note was partially dictated with voice recognition software. Similar sounding words can be transcribed inadequately or may not  be corrected upon review.

## 2023-12-23 LAB — CREATININE, URINE, 24 HOUR
Creatinine, 24H Ur: 1195 mg/(24.h) (ref 1000–2000)
Creatinine, Urine: 91.9 mg/dL

## 2023-12-26 LAB — CORTISOL, URINE, FREE
Cortisol (Ur), Free: 27 ug/(24.h) (ref 5–64)
Cortisol,F,ug/L,U: 21 ug/L

## 2023-12-27 LAB — ALDOSTERONE + RENIN ACTIVITY W/ RATIO
Aldos/Renin Ratio: 6.1 (ref 0.0–30.0)
Aldosterone: 2.5 ng/dL (ref 0.0–30.0)
Renin Activity, Plasma: 0.411 ng/mL/h (ref 0.167–5.380)

## 2023-12-27 LAB — VITAMIN D 25 HYDROXY (VIT D DEFICIENCY, FRACTURES): Vit D, 25-Hydroxy: 22.8 ng/mL — ABNORMAL LOW (ref 30.0–100.0)

## 2023-12-27 LAB — T4, FREE: Free T4: 0.98 ng/dL (ref 0.82–1.77)

## 2023-12-27 LAB — TSH: TSH: 1.65 u[IU]/mL (ref 0.450–4.500)

## 2024-01-08 ENCOUNTER — Ambulatory Visit (HOSPITAL_COMMUNITY)

## 2024-01-10 ENCOUNTER — Ambulatory Visit (HOSPITAL_COMMUNITY)
Admission: RE | Admit: 2024-01-10 | Discharge: 2024-01-10 | Disposition: A | Discharge status: Home / Self Care | Source: Ambulatory Visit | Attending: "Endocrinology | Admitting: "Endocrinology

## 2024-01-10 DIAGNOSIS — I712 Thoracic aortic aneurysm, without rupture, unspecified: Secondary | ICD-10-CM | POA: Diagnosis present

## 2024-01-10 DIAGNOSIS — E279 Disorder of adrenal gland, unspecified: Secondary | ICD-10-CM | POA: Diagnosis present

## 2024-01-10 LAB — POCT I-STAT CREATININE: Creatinine, Ser: 0.9 mg/dL (ref 0.61–1.24)

## 2024-01-10 MED ORDER — IOHEXOL 350 MG/ML SOLN
80.0000 mL | Freq: Once | INTRAVENOUS | Status: AC | PRN
  Administered 2024-01-10 (×2): 80 mL via INTRAVENOUS

## 2024-01-15 ENCOUNTER — Ambulatory Visit (INDEPENDENT_AMBULATORY_CARE_PROVIDER_SITE_OTHER): Admitting: "Endocrinology

## 2024-01-15 ENCOUNTER — Encounter: Payer: Self-pay | Admitting: "Endocrinology

## 2024-01-15 VITALS — BP 118/78 | HR 64 | Ht 69.0 in | Wt 161.0 lb

## 2024-01-15 DIAGNOSIS — E1165 Type 2 diabetes mellitus with hyperglycemia: Secondary | ICD-10-CM

## 2024-01-15 DIAGNOSIS — E559 Vitamin D deficiency, unspecified: Secondary | ICD-10-CM | POA: Insufficient documentation

## 2024-01-15 DIAGNOSIS — I10 Essential (primary) hypertension: Secondary | ICD-10-CM | POA: Diagnosis not present

## 2024-01-15 DIAGNOSIS — E279 Disorder of adrenal gland, unspecified: Secondary | ICD-10-CM | POA: Diagnosis not present

## 2024-01-15 NOTE — Progress Notes (Signed)
 Endocrinology follow-up note                                             01/15/2024, 12:45 PM   Subjective:    Patient ID: Kirk Bell, male    DOB: 1955/07/16, PCP Volanda Saupe, MD   Past Medical History:  Diagnosis Date   Ascending aortic aneurysm (HCC)    4.5cm under surveillance by [facility]   Back pain    Degenerative disc disease, cervical    Emphysema of lung (HCC)    Hearing impaired person, bilateral    Lung nodules    Melanoma (HCC)    Scoliosis    Past Surgical History:  Procedure Laterality Date   MELANOMA EXCISION     NASAL SINUS SURGERY     Social History   Socioeconomic History   Marital status: Single    Spouse name: Not on file   Number of children: Not on file   Years of education: Not on file   Highest education level: Not on file  Occupational History   Not on file  Tobacco Use   Smoking status: Every Day    Current packs/day: 0.50    Average packs/day: 0.5 packs/day for 55.6 years (27.8 ttl pk-yrs)    Types: Cigarettes    Start date: 06/04/1968   Smokeless tobacco: Never  Vaping Use   Vaping status: Never Used  Substance and Sexual Activity   Alcohol use: No    Comment: No alcohol since the 90s.   Drug use: Not Currently    Types: Marijuana   Sexual activity: Yes  Other Topics Concern   Not on file  Social History Narrative   Not on file   Social Drivers of Health   Financial Resource Strain: Not on file  Food Insecurity: Not on file  Transportation Needs: Not on file  Physical Activity: Not on file  Stress: Not on file  Social Connections: Not on file   Family History  Problem Relation Age of Onset   Leukemia Mother        hairy cell leukemia   Other Father        polio   Liver disease Brother        heavy etoh use   Heart disease Brother    Emphysema Brother    Colon cancer Neg Hx    Outpatient Encounter Medications as of 01/15/2024  Medication Sig   atorvastatin (LIPITOR)  20 MG tablet Take 20 mg by mouth daily.   baclofen (LIORESAL) 10 MG tablet Take 10 mg by mouth 4 (four) times daily. (Patient not taking: Reported on 12/18/2023)   Carboxymethylcellulose Sod PF 0.5 % SOLN Apply 1 drop to eye 4 (four) times daily.   carvedilol  (COREG ) 12.5 MG tablet Take 6.25 mg by mouth 2 (two) times daily with a meal.   cyclobenzaprine (FLEXERIL) 10 MG tablet Take 10 mg by mouth in the morning, at noon, in the evening, and at bedtime.   cycloSPORINE (RESTASIS) 0.05 % ophthalmic emulsion Place 1 drop into both eyes every 12 (twelve) hours.   diphenhydrAMINE (BENADRYL) 25 mg capsule Take 25 mg by mouth at bedtime.   famotidine  (PEPCID ) 20 MG tablet One after supper (Patient not taking: Reported on 12/18/2023)   HYDROcodone -acetaminophen  (NORCO) 10-325 MG tablet Take 1 tablet by mouth  in the morning, at noon, in the evening, and at bedtime. (Patient taking differently: Take 1 tablet by mouth in the morning, at noon, in the evening, and at bedtime.)   hydrocortisone 2.5 % cream Apply topically.   Melatonin 10 MG TABS Take 1 tablet by mouth as needed.   Multiple Vitamins-Minerals (PRESERVISION AREDS 2) CAPS Take 1 capsule by mouth 2 (two) times daily.   naloxone (NARCAN) nasal spray 4 mg/0.1 mL Place 1 spray into the nose once.   pantoprazole  (PROTONIX ) 40 MG tablet Take 1 tablet (40 mg total) by mouth daily. Take 30-60 min before first meal of the day   Tiotropium Bromide Monohydrate 2.5 MCG/ACT AERS Inhale 1 each into the lungs daily.   venlafaxine (EFFEXOR) 100 MG tablet Take 100 mg by mouth 2 (two) times daily.   zolpidem (AMBIEN) 10 MG tablet Take 10 mg by mouth at bedtime as needed for sleep.   No facility-administered encounter medications on file as of 01/15/2024.   ALLERGIES: Allergies  Allergen Reactions   Prednisone Rash    blisters   Gabapentin     Other reaction(s): Psychotic disorder   Ibuprofen Diarrhea   Morphine Other (See Comments)   Naproxen  Nausea And  Vomiting   Oxycodone Other (See Comments)    VACCINATION STATUS:  There is no immunization history on file for this patient.  HPI Kirk Bell is 68 y.o. male who presents today with a medical history as above. he is being seen in follow-up after he was seen in consultation for left adrenal nodule requested by Volanda Saupe, MD.   He is accompanied by his sister-in-law to clinic.  History is obtained from the family as well as his chart review. He has a long and complicated medical history, has been in the [place] care system for years. She is a smoker and has history of EtOH abuse.  He was being followed for pulmonary nodules with various imaging studies over the years.  Since 2020, he was observed to have some irregularities in his left adrenal gland.  CT abdomen and pelvis with IV contrast in October 2023 showed that left adrenal showing diffuse thickening which appeared to be stable from prior study.  From that study, the right adrenal was hyperplastic.  MRI of thorax and abdomen in July 2024 showed 3 cm left adrenal nodule which appeared compatible with an adenoma. Most recent imaging from May 2025 showed marked adenomatous changes.  Right adrenal gland appeared unremarkable.  His most recent CT adrenals with and without contrast confirmed unchanged, definitely benign macroscopic fat-containing left adrenal adenoma measuring 2.8 x 2.6 cm.  Benign fatty adenomatous thickening of the right adrenal gland. He denies any prior history of adrenal, thyroid, pituitary dysfunctions. Other medical problems include history of melanoma, COPD, degenerative joint disease, scoliosis, ascending aortic aneurysm, hyperlipidemia, pain syndrome, mood disorders, type 2 diabetes.  His most recent A1c was 6.5%.  He is not on medication for diabetes. He continues to smoke. His current medications include atorvastatin, carvedilol  6.25 mg p.o. twice daily, Norco, bronchodilators, Effexor, Narcan, Ambien. He is  interested to have appropriate endocrine workup for his adrenal nodule.   Review of Systems  Constitutional: + Progressively regaining the weight he previously lost.  His highest weight has been 175 pounds.  + fatigue, no subjective hyperthermia, no subjective hypothermia   Objective:       01/15/2024   11:27 AM 12/18/2023    9:31 AM 08/30/2023    4:08 PM  Vitals with  BMI  Height 5' 9 5' 9 5' 9  Weight 161 lbs 164 lbs 10 oz 163 lbs  BMI 23.76 24.3 24.06  Systolic 118 108 869  Diastolic 78 70 78  Pulse 64 60 80    BP 118/78   Pulse 64   Ht 5' 9 (1.753 m)   Wt 161 lb (73 kg)   BMI 23.78 kg/m   Wt Readings from Last 3 Encounters:  01/15/24 161 lb (73 kg)  12/18/23 164 lb 9.6 oz (74.7 kg)  08/30/23 163 lb (73.9 kg)    Physical Exam  Constitutional:  Body mass index is 23.78 kg/m.,  not in acute distress, normal state of mind Eyes: PERRLA, EOMI, no exophthalmos ENT: moist mucous membranes, no gross thyromegaly, no gross cervical lymphadenopathy  Musculoskeletal: + Marked scoliosis of vertebral spine, narrowed rib cage to pelvic crest , no localized tenderness on his spine,  strength intact in all four extremities, no peripheral edema Skin: moist, warm, no rashes Neurological: no tremor with outstretched hands, Deep tendon reflexes normal in bilateral lower extremities.  CMP ( most recent) CMP     Component Value Date/Time   NA 137 09/18/2019 0916   K 4.0 09/18/2019 0916   CL 102 09/18/2019 0916   CO2 26 09/18/2019 0916   GLUCOSE 121 (H) 09/18/2019 0916   BUN 19 09/18/2019 0916   CREATININE 0.90 01/10/2024 1227   CALCIUM 8.7 (L) 09/18/2019 0916   PROT 6.2 11/08/2010 0603   ALBUMIN 3.2 (L) 11/08/2010 0603   AST 14 11/08/2010 0603   ALT 13 11/08/2010 0603   ALKPHOS 73 11/08/2010 0603   BILITOT 0.2 (L) 11/08/2010 0603   GFRNONAA >60 09/18/2019 0916     Lab Results  Component Value Date   TSH 1.650 12/22/2023   TSH 1.453 11/07/2010   FREET4 0.98  12/22/2023    CT adrenals with and without contrast on January 10, 2024 IMPRESSION: 1. Unchanged, definitively benign macroscopic fat containing left adrenal adenoma measuring 2.8 x 2.6 cm. No further follow-up or characterization is required for this benign finding. 2. Benign fatty adenomatous thickening of the right adrenal gland likewise requiring no further follow-up or characterization.   Aortic Atherosclerosis (ICD10-I70.0).  Recent Results (from the past 2160 hours)  Lab report - scanned     Status: None   Collection Time: 10/20/23 12:00 AM  Result Value Ref Range   A1c 6.5     Comment: abst by him  Aldosterone + renin activity w/ ratio     Status: None   Collection Time: 12/22/23 10:48 AM  Result Value Ref Range   Aldosterone 2.5 0.0 - 30.0 ng/dL   Renin Activity, Plasma 0.411 0.167 - 5.380 ng/mL/hr   Aldos/Renin Ratio 6.1 0.0 - 30.0    Comment:                          Units:      ng/dL per ng/mL/hr  VITAMIN D  25 Hydroxy (Vit-D Deficiency, Fractures)     Status: Abnormal   Collection Time: 12/22/23 10:48 AM  Result Value Ref Range   Vit D, 25-Hydroxy 22.8 (L) 30.0 - 100.0 ng/mL    Comment: Vitamin D  deficiency has been defined by the Institute of Medicine and an Endocrine Society practice guideline as a level of serum 25-OH vitamin D  less than 20 ng/mL (1,2). The Endocrine Society went on to further define vitamin D  insufficiency as a level between 21 and 29  ng/mL (2). 1. IOM (Institute of Medicine). 2010. Dietary reference    intakes for calcium and D. [place]: The    [employer]. 2. Holick MF, Binkley [place], Bischoff-Ferrari HA, et al.    Evaluation, treatment, and prevention of vitamin D     deficiency: an Endocrine Society clinical practice    guideline. JCEM. 2011 Jul; 96(7):1911-30.   TSH     Status: None   Collection Time: 12/22/23 10:48 AM  Result Value Ref Range   TSH 1.650 0.450 - 4.500 uIU/mL  T4, free     Status: None   Collection  Time: 12/22/23 10:48 AM  Result Value Ref Range   Free T4 0.98 0.82 - 1.77 ng/dL  Cortisol, urine, free     Status: None   Collection Time: 12/22/23 11:24 AM  Result Value Ref Range   Cortisol,F,ug/L,U 21 Undefined ug/L   Cortisol (Ur), Free 27 5 - 64 ug/24 hr  Creatinine, urine, 24 hour     Status: None   Collection Time: 12/22/23 11:27 AM  Result Value Ref Range   Creatinine, Urine 91.9 Not Estab. mg/dL   Creatinine, 75Y Ur 8,804 1,000 - 2,000 mg/24 hr  I-STAT creatinine     Status: None   Collection Time: 01/10/24 12:27 PM  Result Value Ref Range   Creatinine, Ser 0.90 0.61 - 1.24 mg/dL     Assessment & Plan:   1. Adrenal nodule (HCC)  2. Type 2 diabetes mellitus with hyperglycemia 3. Essential hypertension, benign  - Jp Blaylock  is being seen at a kind request of Volanda Saupe, MD. - I have reviewed his new and available  records and clinically evaluated the patient. - Based on these reviews, he has benign, nonfunctioning left adrenal adenoma and benign thickening of the right adrenal gland which will not require any further imaging or follow-up.  - He is plasma aldosterone concentration, plasma renin activity were normal.  -This patient with significant kyphoscoliosis, EtOH/smoking, height loss, would benefit from screening for osteoporosis.  He will have this bone density done prior to his next visit in 3-4 months.  He will benefit from over-the-counter vitamin D  supplements   Regarding his type 2 diabetes, likely responding to his recent weight loss.  His recent A1c was 6.5%, improving from 8.3% documented in his records.   He will not need intervention with medications for now.   - he acknowledges that there is a room for improvement in his food and drink choices. - Suggestion is made for him to avoid simple carbohydrates  from his diet including Cakes, Sweet Desserts, Ice Cream, Soda (diet and regular), Sweet Tea, Candies, Chips, Cookies, Store Bought Juices,  Alcohol , Artificial Sweeteners,  Coffee Creamer, and Sugar-free Products, Lemonade. This will help patient to have more stable blood glucose profile and potentially avoid unintended weight gain.  -he is encouraged to continue follow-up with his other specialist and PMD. - he is advised to maintain close follow up with Volanda Saupe, MD for primary care needs.   I spent  26  minutes in the care of the patient today including review of labs from Thyroid Function, CMP, and other relevant labs ; imaging/biopsy records (current and previous including abstractions from other facilities); face-to-face time discussing  his lab results and symptoms, medications doses, his options of short and long term treatment based on the latest standards of care / guidelines;   and documenting the encounter.  Tucker Parisien  participated in the discussions, expressed understanding,  and voiced agreement with the above plans.  All questions were answered to his satisfaction. he is encouraged to contact clinic should he have any questions or concerns prior to his return visit.   Follow up plan: Return in about 3 months (around 04/16/2024) for DXA Scan B4 NV, A1c -NV, F/U with Pre-visit Labs.   Ranny Earl, MD [facility] [address] [place], [place] [address] Phone: [phone]  Fax: [phone]     01/15/2024, 12:45 PM  This note was partially dictated with voice recognition software. Similar sounding words can be transcribed inadequately or may not  be corrected upon review.

## 2024-01-18 ENCOUNTER — Ambulatory Visit (HOSPITAL_COMMUNITY)
Admission: RE | Admit: 2024-01-18 | Discharge: 2024-01-18 | Disposition: A | Discharge status: Home / Self Care | Source: Ambulatory Visit | Attending: "Endocrinology | Admitting: "Endocrinology

## 2024-01-18 DIAGNOSIS — E279 Disorder of adrenal gland, unspecified: Secondary | ICD-10-CM | POA: Insufficient documentation

## 2024-04-16 ENCOUNTER — Ambulatory Visit: Admitting: "Endocrinology

## 2024-04-17 LAB — COMPREHENSIVE METABOLIC PANEL WITH GFR
ALT: 15 IU/L (ref 0–44)
AST: 18 IU/L (ref 0–40)
Albumin: 3.9 g/dL (ref 3.9–4.9)
Alkaline Phosphatase: 85 IU/L (ref 47–123)
BUN/Creatinine Ratio: 21 (ref 10–24)
BUN: 17 mg/dL (ref 8–27)
Bilirubin Total: 0.3 mg/dL (ref 0.0–1.2)
CO2: 22 mmol/L (ref 20–29)
Calcium: 9.6 mg/dL (ref 8.6–10.2)
Chloride: 107 mmol/L — ABNORMAL HIGH (ref 96–106)
Creatinine, Ser: 0.82 mg/dL (ref 0.76–1.27)
Globulin, Total: 2.9 g/dL (ref 1.5–4.5)
Glucose: 103 mg/dL — ABNORMAL HIGH (ref 70–99)
Potassium: 4.7 mmol/L (ref 3.5–5.2)
Sodium: 141 mmol/L (ref 134–144)
Total Protein: 6.8 g/dL (ref 6.0–8.5)
eGFR: 94 mL/min/1.73 (ref >59)

## 2024-04-17 LAB — LIPID PANEL
Chol/HDL Ratio: 3.2 ratio (ref 0.0–5.0)
Cholesterol, Total: 109 mg/dL (ref 100–199)
HDL: 34 mg/dL — ABNORMAL LOW (ref >39)
LDL Chol Calc (NIH): 58 mg/dL (ref 0–99)
Triglycerides: 83 mg/dL (ref 0–149)
VLDL Cholesterol Cal: 17 mg/dL (ref 5–40)

## 2024-04-17 LAB — TSH: TSH: 0.727 u[IU]/mL (ref 0.450–4.500)

## 2024-04-17 LAB — T4, FREE: Free T4: 1.07 ng/dL (ref 0.82–1.77)

## 2024-04-17 LAB — VITAMIN D 25 HYDROXY (VIT D DEFICIENCY, FRACTURES): Vit D, 25-Hydroxy: 25.4 ng/mL — ABNORMAL LOW (ref 30.0–100.0)

## 2024-04-23 ENCOUNTER — Encounter: Payer: Self-pay | Admitting: "Endocrinology

## 2024-04-23 ENCOUNTER — Ambulatory Visit (INDEPENDENT_AMBULATORY_CARE_PROVIDER_SITE_OTHER): Admitting: "Endocrinology

## 2024-04-23 VITALS — BP 126/78 | HR 68 | Ht 69.0 in | Wt 163.8 lb

## 2024-04-23 DIAGNOSIS — M810 Age-related osteoporosis without current pathological fracture: Secondary | ICD-10-CM | POA: Diagnosis not present

## 2024-04-23 DIAGNOSIS — E1165 Type 2 diabetes mellitus with hyperglycemia: Secondary | ICD-10-CM | POA: Diagnosis not present

## 2024-04-23 DIAGNOSIS — E279 Disorder of adrenal gland, unspecified: Secondary | ICD-10-CM

## 2024-04-23 DIAGNOSIS — E782 Mixed hyperlipidemia: Secondary | ICD-10-CM | POA: Insufficient documentation

## 2024-04-23 LAB — POCT GLYCOSYLATED HEMOGLOBIN (HGB A1C): HbA1c, POC (controlled diabetic range): 6.5 % (ref 0.0–7.0)

## 2024-04-23 MED ORDER — ALENDRONATE SODIUM 70 MG PO TABS: 70.0000 mg | ORAL_TABLET | ORAL | 3 refills | Status: AC

## 2024-04-23 MED ORDER — OYSTER SHELL CALCIUM/D3 500-5 MG-MCG PO TABS: 1.0000 | ORAL_TABLET | Freq: Two times a day (BID) | ORAL | 1 refills | Status: AC

## 2024-04-23 NOTE — Progress Notes (Signed)
 Endocrinology follow-up note                                             04/23/2024, 1:01 PM   Subjective:    Patient ID: Kirk Bell, male    DOB: December 14, 1955, PCP Volanda Saupe, MD   Past Medical History:  Diagnosis Date   Ascending aortic aneurysm    4.5cm under surveillance by [facility]   Back pain    Degenerative disc disease, cervical    Emphysema of lung (HCC)    Hearing impaired person, bilateral    Lung nodules    Melanoma (HCC)    Scoliosis    Past Surgical History:  Procedure Laterality Date   MELANOMA EXCISION     NASAL SINUS SURGERY     Social History   Socioeconomic History   Marital status: Single    Spouse name: Not on file   Number of children: Not on file   Years of education: Not on file   Highest education level: Not on file  Occupational History   Not on file  Tobacco Use   Smoking status: Every Day    Current packs/day: 0.50    Average packs/day: 0.5 packs/day for 55.9 years (27.9 ttl pk-yrs)    Types: Cigarettes    Start date: 06/04/1968   Smokeless tobacco: Never  Vaping Use   Vaping status: Never Used  Substance and Sexual Activity   Alcohol use: No    Comment: No alcohol since the 90s.   Drug use: Not Currently    Types: Marijuana   Sexual activity: Yes  Other Topics Concern   Not on file  Social History Narrative   Not on file   Social Drivers of Health   Financial Resource Strain: Not on file  Food Insecurity: Not on file  Transportation Needs: Not on file  Physical Activity: Not on file  Stress: Not on file  Social Connections: Not on file   Family History  Problem Relation Age of Onset   Leukemia Mother        hairy cell leukemia   Other Father        polio   Liver disease Brother        heavy etoh use   Heart disease Brother    Emphysema Brother    Colon cancer Neg Hx    Outpatient Encounter Medications as of 04/23/2024  Medication Sig   alendronate  (FOSAMAX ) 70 MG  tablet Take 1 tablet (70 mg total) by mouth every 7 (seven) days. Take with a full glass of water on an empty stomach.   calcium -vitamin D  (OSCAL WITH D) 500-5 MG-MCG tablet Take 1 tablet by mouth 2 (two) times daily.   atorvastatin (LIPITOR) 20 MG tablet Take 20 mg by mouth daily.   baclofen (LIORESAL) 10 MG tablet Take 10 mg by mouth 4 (four) times daily. (Patient not taking: Reported on 12/18/2023)   Carboxymethylcellulose Sod PF 0.5 % SOLN Apply 1 drop to eye 4 (four) times daily.   carvedilol  (COREG ) 12.5 MG tablet Take 6.25 mg by mouth 2 (two) times daily with a meal.   cyclobenzaprine (FLEXERIL) 10 MG tablet Take 10 mg by mouth in the morning, at noon, in the evening, and at bedtime.   cycloSPORINE (RESTASIS) 0.05 % ophthalmic emulsion Place 1 drop into  both eyes every 12 (twelve) hours.   diphenhydrAMINE (BENADRYL) 25 mg capsule Take 25 mg by mouth at bedtime.   famotidine  (PEPCID ) 20 MG tablet One after supper (Patient not taking: Reported on 12/18/2023)   HYDROcodone -acetaminophen  (NORCO) 10-325 MG tablet Take 1 tablet by mouth in the morning, at noon, in the evening, and at bedtime. (Patient taking differently: Take 1 tablet by mouth in the morning, at noon, in the evening, and at bedtime.)   hydrocortisone 2.5 % cream Apply topically.   Melatonin 10 MG TABS Take 1 tablet by mouth as needed.   Multiple Vitamins-Minerals (PRESERVISION AREDS 2) CAPS Take 1 capsule by mouth 2 (two) times daily.   naloxone (NARCAN) nasal spray 4 mg/0.1 mL Place 1 spray into the nose once.   pantoprazole  (PROTONIX ) 40 MG tablet Take 1 tablet (40 mg total) by mouth daily. Take 30-60 min before first meal of the day   Tiotropium Bromide Monohydrate 2.5 MCG/ACT AERS Inhale 1 each into the lungs daily.   venlafaxine (EFFEXOR) 100 MG tablet Take 100 mg by mouth 2 (two) times daily.   zolpidem (AMBIEN) 10 MG tablet Take 10 mg by mouth at bedtime as needed for sleep.   No facility-administered encounter medications  on file as of 04/23/2024.   ALLERGIES: Allergies  Allergen Reactions   Prednisone Rash    blisters   Gabapentin     Other reaction(s): Psychotic disorder   Ibuprofen Diarrhea   Morphine Other (See Comments)   Naproxen  Nausea And Vomiting   Oxycodone Other (See Comments)    VACCINATION STATUS:  There is no immunization history on file for this patient.  HPI Kirk Bell is 68 y.o. male who presents today with a medical history as above. he is being seen in follow-up after he was seen in consultation for left adrenal nodule requested by Volanda Saupe, MD.   He is accompanied by his sister-in-law to clinic.  History is obtained from the family as well as his chart review. He has a long and complicated medical history, has been in the [place] care system for years. He is a smoker and has history of EtOH abuse.  He was being followed for pulmonary nodules with various imaging studies over the years.  Since 2020, he was observed to have some irregularities in his left adrenal gland.  CT abdomen and pelvis with IV contrast in October 2023 showed that left adrenal showing diffuse thickening which appeared to be stable from prior study.  From that study, the right adrenal was hyperplastic.  MRI of thorax and abdomen in July 2024 showed 3 cm left adrenal nodule which appeared compatible with an adenoma. Most recent imaging from May 2025 showed marked adenomatous changes.  Right adrenal gland appeared unremarkable.  His most recent CT adrenals with and without contrast confirmed unchanged, definitely benign macroscopic fat-containing left adrenal adenoma measuring 2.8 x 2.6 cm.  Benign fatty adenomatous thickening of the right adrenal gland. He denies any prior history of adrenal, thyroid, pituitary dysfunctions. Other medical problems include history of melanoma, COPD, degenerative joint disease, scoliosis, ascending aortic aneurysm, hyperlipidemia, pain syndrome, mood disorders, type 2  diabetes.  His point-of-care A1c is stable at 6.5%.  He is not on medication for diabetes. He continues to smoke. His current medications include atorvastatin, carvedilol  6.25 mg p.o. twice daily, Norco, bronchodilators, Effexor, Narcan, Ambien.  - After his last visit, he was offered screening bone density which showed osteoporosis.   Review of Systems  Constitutional: + Mildly  fluctuating body weight.   His highest weight has been 175 pounds.  + fatigue, no subjective hyperthermia, no subjective hypothermia   Objective:       04/23/2024   11:41 AM 01/15/2024   11:27 AM 12/18/2023    9:31 AM  Vitals with BMI  Height 5' 9 5' 9 5' 9  Weight 163 lbs 13 oz 161 lbs 164 lbs 10 oz  BMI 24.18 23.76 24.3  Systolic 126 118 891  Diastolic 78 78 70  Pulse 68 64 60    BP 126/78   Pulse 68   Ht 5' 9 (1.753 m)   Wt 163 lb 12.8 oz (74.3 kg)   BMI 24.19 kg/m   Wt Readings from Last 3 Encounters:  04/23/24 163 lb 12.8 oz (74.3 kg)  01/15/24 161 lb (73 kg)  12/18/23 164 lb 9.6 oz (74.7 kg)    Physical Exam  Constitutional:  Body mass index is 24.19 kg/m.,  not in acute distress, normal state of mind  Musculoskeletal: + Marked scoliosis of vertebral spine, narrowed rib cage to pelvic crest , no localized tenderness on his spine,  strength intact in all four extremities, no peripheral edema   CMP ( most recent) CMP     Component Value Date/Time   NA 141 04/16/2024 0841   K 4.7 04/16/2024 0841   CL 107 (H) 04/16/2024 0841   CO2 22 04/16/2024 0841   GLUCOSE 103 (H) 04/16/2024 0841   GLUCOSE 121 (H) 09/18/2019 0916   BUN 17 04/16/2024 0841   CREATININE 0.82 04/16/2024 0841   CALCIUM  9.6 04/16/2024 0841   PROT 6.8 04/16/2024 0841   ALBUMIN 3.9 04/16/2024 0841   AST 18 04/16/2024 0841   ALT 15 04/16/2024 0841   ALKPHOS 85 04/16/2024 0841   BILITOT 0.3 04/16/2024 0841   EGFR 94 04/16/2024 0841   GFRNONAA >60 09/18/2019 0916     Lab Results  Component Value Date   TSH  0.727 04/16/2024   TSH 1.650 12/22/2023   TSH 1.453 11/07/2010   FREET4 1.07 04/16/2024   FREET4 0.98 12/22/2023    CT adrenals with and without contrast on January 10, 2024 IMPRESSION: 1. Unchanged, definitively benign macroscopic fat containing left adrenal adenoma measuring 2.8 x 2.6 cm. No further follow-up or characterization is required for this benign finding. 2. Benign fatty adenomatous thickening of the right adrenal gland likewise requiring no further follow-up or characterization.   Aortic Atherosclerosis (ICD10-I70.0).  Bone density on January 18, 2024 FINDINGS: Scan quality: Good.   LEFT FEMORAL NECK:   BMD (in g/cm2): 0.874,  T-score: -1.5,  Z-score: -0.3   LEFT TOTAL HIP:   BMD (in g/cm2): 0.825,  T-score: -1.9,  Z-score: -1.2   RIGHT FEMORAL NECK:   BMD (in g/cm2): 0.849,  T-score: -1.7,  Z-score: -0.5   RIGHT TOTAL HIP:   BMD (in g/cm2): 0.740,  T-score: -2.5,  Z-score: -1.8   LEFT FOREARM (RADIUS 33%):   BMD (in g/cm2): 0.805,  T-score: 0.1,  Z-score: 0.9   FRAX 10-YEAR PROBABILITY OF FRACTURE:   FRAX not reported as the lowest BMD is not in the osteopenia range.   IMPRESSION: Osteoporosis based on BMD.   Fracture risk is increased. Increased risk is based on low BMD.  Recent Results (from the past 2160 hours)  Comprehensive metabolic panel with GFR     Status: Abnormal   Collection Time: 04/16/24  8:41 AM  Result Value Ref Range   Glucose 103 (H) 70 -  99 mg/dL   BUN 17 8 - 27 mg/dL   Creatinine, Ser 9.17 0.76 - 1.27 mg/dL   eGFR 94 >40 fO/fpw/8.26   BUN/Creatinine Ratio 21 10 - 24   Sodium 141 134 - 144 mmol/L   Potassium 4.7 3.5 - 5.2 mmol/L   Chloride 107 (H) 96 - 106 mmol/L   CO2 22 20 - 29 mmol/L   Calcium  9.6 8.6 - 10.2 mg/dL   Total Protein 6.8 6.0 - 8.5 g/dL   Albumin 3.9 3.9 - 4.9 g/dL   Globulin, Total 2.9 1.5 - 4.5 g/dL   Bilirubin Total 0.3 0.0 - 1.2 mg/dL   Alkaline Phosphatase 85 47 - 123 IU/L   AST 18 0 - 40 IU/L    ALT 15 0 - 44 IU/L  Lipid panel     Status: Abnormal   Collection Time: 04/16/24  8:41 AM  Result Value Ref Range   Cholesterol, Total 109 100 - 199 mg/dL   Triglycerides 83 0 - 149 mg/dL   HDL 34 (L) >60 mg/dL   VLDL Cholesterol Cal 17 5 - 40 mg/dL   LDL Chol Calc (NIH) 58 0 - 99 mg/dL   Chol/HDL Ratio 3.2 0.0 - 5.0 ratio    Comment:                                   T. Chol/HDL Ratio                                             Men  Women                               1/2 Avg.Risk  3.4    3.3                                   Avg.Risk  5.0    4.4                                2X Avg.Risk  9.6    7.1                                3X Avg.Risk 23.4   11.0   TSH     Status: None   Collection Time: 04/16/24  8:41 AM  Result Value Ref Range   TSH 0.727 0.450 - 4.500 uIU/mL  T4, free     Status: None   Collection Time: 04/16/24  8:41 AM  Result Value Ref Range   Free T4 1.07 0.82 - 1.77 ng/dL  VITAMIN D  25 Hydroxy (Vit-D Deficiency, Fractures)     Status: Abnormal   Collection Time: 04/16/24  8:41 AM  Result Value Ref Range   Vit D, 25-Hydroxy 25.4 (L) 30.0 - 100.0 ng/mL    Comment: Vitamin D  deficiency has been defined by the Institute of Medicine and an Endocrine Society practice guideline as a level of serum 25-OH vitamin D  less than 20 ng/mL (1,2). The Endocrine Society went on to further define vitamin D  insufficiency as a  level between 21 and 29 ng/mL (2). 1. IOM (Institute of Medicine). 2010. Dietary reference    intakes for calcium  and D. [place]: The    [employer]. 2. Holick MF, Binkley Tarrant, Bischoff-Ferrari HA, et al.    Evaluation, treatment, and prevention of vitamin D     deficiency: an Endocrine Society clinical practice    guideline. JCEM. 2011 Jul; 96(7):1911-30.   HgB A1c     Status: None   Collection Time: 04/23/24 11:56 AM  Result Value Ref Range   Hemoglobin A1C     HbA1c POC (<> result, manual entry)     HbA1c, POC (prediabetic range)      HbA1c, POC (controlled diabetic range) 6.5 0.0 - 7.0 %     Assessment & Plan:   1. Adrenal nodule-worked up and stable 2. Type 2 diabetes mellitus  3.  Osteoporosis 4. Essential hypertension, benign  - [place]  is being seen at a kind request of Volanda Saupe, MD. - I have reviewed his new and available  records and clinically evaluated the patient. - Based on these reviews, he has benign, nonfunctioning left adrenal adenoma and benign thickening of the right adrenal gland which will not require any further imaging or follow-up.  - He is plasma aldosterone concentration, plasma renin activity were normal.  -This patient with significant kyphoscoliosis, EtOH/smoking, height loss.  His screening bone density is significant for osteoporosis.  Fracture risk was discussed with him.  He is approached with treatment options and he agrees to initiate Fosamax  70 mg p.o. weekly.  Side effects and precaution discussed with him. He is also given a prescription for calcium  and vitamin D  supplements.   Regarding his type 2 diabetes, likely responding to his recent weight loss.  His recent A1c was 6.5%, improving from 8.3% documented in his records.   He will not need any intervention with medications for diabetes for now.    - he acknowledges that there is a room for improvement in his food and drink choices. - Suggestion is made for him to avoid simple carbohydrates  from his diet including Cakes, Sweet Desserts, Ice Cream, Soda (diet and regular), Sweet Tea, Candies, Chips, Cookies, Store Bought Juices, Alcohol , Artificial Sweeteners,  Coffee Creamer, and Sugar-free Products, Lemonade. This will help patient to have more stable blood glucose profile and potentially avoid unintended weight gain.   -He is advised to continue his atorvastatin 20 mg p.o. nightly for hyperlipidemia; Coreg  12.5 mg p.o. twice daily for hypertension. Patient on chronic opioid, at risk for adrenal  insufficiency.  He will be considered for a.m. cortisol on subsequent visits.  -he is encouraged to continue follow-up with his other specialist and PMD. - he is advised to maintain close follow up with Volanda Saupe, MD for primary care needs.   I spent  26  minutes in the care of the patient today including review of labs from Thyroid Function, CMP, and other relevant labs ; imaging/biopsy records (current and previous including abstractions from other facilities); face-to-face time discussing  his lab results and symptoms, medications doses, his options of short and long term treatment based on the latest standards of care / guidelines;   and documenting the encounter.  Isami Waskey  participated in the discussions, expressed understanding, and voiced agreement with the above plans.  All questions were answered to his satisfaction. he is encouraged to contact clinic should he have any questions or concerns prior to his return visit.  Follow up plan: Return in about 6 months (around 10/21/2024) for F/U with Pre-visit Labs, A1c -NV.   Ranny Earl, MD [facility] [address] [place], [place] [address] Phone: [phone]  Fax: [phone]     04/23/2024, 1:01 PM  This note was partially dictated with voice recognition software. Similar sounding words can be transcribed inadequately or may not  be corrected upon review.

## 2024-10-22 ENCOUNTER — Ambulatory Visit: Admitting: "Endocrinology
